# Patient Record
Sex: Female | Born: 1970 | Race: White | Hispanic: No | Smoking: Current every day smoker
Health system: Southern US, Community
[De-identification: ages and names within clinical notes are randomized; demographics above are authoritative.]

## PROBLEM LIST (undated history)

## (undated) DIAGNOSIS — G893 Neoplasm related pain (acute) (chronic): Secondary | ICD-10-CM

## (undated) DIAGNOSIS — R053 Chronic cough: Secondary | ICD-10-CM

## (undated) DIAGNOSIS — R05 Cough: Secondary | ICD-10-CM

## (undated) DIAGNOSIS — C539 Malignant neoplasm of cervix uteri, unspecified: Secondary | ICD-10-CM

## (undated) HISTORY — DX: Neoplasm related pain (acute) (chronic): G89.3

## (undated) HISTORY — DX: Malignant neoplasm of cervix uteri, unspecified: C53.9

## (undated) HISTORY — PX: CERVICAL BIOPSY  W/ LOOP ELECTRODE EXCISION: SUR135

## (undated) HISTORY — DX: Chronic cough: R05.3

---
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
## 2009-01-24 ENCOUNTER — Ambulatory Visit: Payer: Self-pay | Admitting: Internal Medicine

## 2014-09-17 DIAGNOSIS — C649 Malignant neoplasm of unspecified kidney, except renal pelvis: Secondary | ICD-10-CM

## 2014-09-17 HISTORY — DX: Malignant neoplasm of unspecified kidney, except renal pelvis: C64.9

## 2015-03-02 ENCOUNTER — Emergency Department
Admission: EM | Admit: 2015-03-02 | Discharge: 2015-03-02 | Disposition: A | Discharge status: Home / Self Care | Payer: Self-pay | Attending: Emergency Medicine | Admitting: Emergency Medicine

## 2015-03-02 ENCOUNTER — Emergency Department: Payer: Self-pay

## 2015-03-02 DIAGNOSIS — F10239 Alcohol dependence with withdrawal, unspecified: Secondary | ICD-10-CM | POA: Insufficient documentation

## 2015-03-02 DIAGNOSIS — Y998 Other external cause status: Secondary | ICD-10-CM | POA: Insufficient documentation

## 2015-03-02 DIAGNOSIS — Y9389 Activity, other specified: Secondary | ICD-10-CM | POA: Insufficient documentation

## 2015-03-02 DIAGNOSIS — X58XXXA Exposure to other specified factors, initial encounter: Secondary | ICD-10-CM | POA: Insufficient documentation

## 2015-03-02 DIAGNOSIS — S01512A Laceration without foreign body of oral cavity, initial encounter: Secondary | ICD-10-CM | POA: Insufficient documentation

## 2015-03-02 DIAGNOSIS — Z720 Tobacco use: Secondary | ICD-10-CM | POA: Insufficient documentation

## 2015-03-02 DIAGNOSIS — F10230 Alcohol dependence with withdrawal, uncomplicated: Secondary | ICD-10-CM

## 2015-03-02 DIAGNOSIS — Y9289 Other specified places as the place of occurrence of the external cause: Secondary | ICD-10-CM | POA: Insufficient documentation

## 2015-03-02 DIAGNOSIS — S0990XA Unspecified injury of head, initial encounter: Secondary | ICD-10-CM | POA: Insufficient documentation

## 2015-03-02 DIAGNOSIS — R569 Unspecified convulsions: Secondary | ICD-10-CM | POA: Insufficient documentation

## 2015-03-02 DIAGNOSIS — F10930 Alcohol use, unspecified with withdrawal, uncomplicated: Secondary | ICD-10-CM

## 2015-03-02 LAB — CBC WITH DIFFERENTIAL/PLATELET
Basophils Absolute: 0 10*3/uL (ref 0–0.1)
Basophils Relative: 0 %
EOS ABS: 0 10*3/uL (ref 0–0.7)
Eosinophils Relative: 1 %
HCT: 42.9 % (ref 35.0–47.0)
HEMOGLOBIN: 14.9 g/dL (ref 12.0–16.0)
LYMPHS ABS: 0.4 10*3/uL — AB (ref 1.0–3.6)
Lymphocytes Relative: 8 %
MCH: 36.9 pg — AB (ref 26.0–34.0)
MCHC: 34.6 g/dL (ref 32.0–36.0)
MCV: 106.6 fL — ABNORMAL HIGH (ref 80.0–100.0)
Monocytes Absolute: 0.4 10*3/uL (ref 0.2–0.9)
NEUTROS ABS: 4.8 10*3/uL (ref 1.4–6.5)
Neutrophils Relative %: 84 %
Platelets: 63 10*3/uL — ABNORMAL LOW (ref 150–440)
RBC: 4.02 MIL/uL (ref 3.80–5.20)
RDW: 13.7 % (ref 11.5–14.5)
WBC: 5.6 10*3/uL (ref 3.6–11.0)

## 2015-03-02 LAB — COMPREHENSIVE METABOLIC PANEL
ALT: 130 U/L — ABNORMAL HIGH (ref 14–54)
AST: 221 U/L — ABNORMAL HIGH (ref 15–41)
Albumin: 3.9 g/dL (ref 3.5–5.0)
Alkaline Phosphatase: 88 U/L (ref 38–126)
Anion gap: 17 — ABNORMAL HIGH (ref 5–15)
BUN: 7 mg/dL (ref 6–20)
CHLORIDE: 90 mmol/L — AB (ref 101–111)
CO2: 24 mmol/L (ref 22–32)
Calcium: 9.5 mg/dL (ref 8.9–10.3)
Creatinine, Ser: 0.69 mg/dL (ref 0.44–1.00)
Glucose, Bld: 112 mg/dL — ABNORMAL HIGH (ref 65–99)
Potassium: 3.1 mmol/L — ABNORMAL LOW (ref 3.5–5.1)
SODIUM: 131 mmol/L — AB (ref 135–145)
Total Bilirubin: 1.2 mg/dL (ref 0.3–1.2)
Total Protein: 6.8 g/dL (ref 6.5–8.1)

## 2015-03-02 LAB — ETHANOL: Alcohol, Ethyl (B): <5 mg/dL (ref <5)

## 2015-03-02 MED ORDER — CHLORDIAZEPOXIDE HCL 25 MG PO CAPS: 25.0000 mg | ORAL_CAPSULE | Freq: Once | ORAL | Status: DC

## 2015-03-02 MED ORDER — CHLORDIAZEPOXIDE HCL 25 MG PO CAPS
25.0000 mg | ORAL_CAPSULE | Freq: Once | ORAL | Status: AC
  Administered 2015-03-02: 25 mg via ORAL

## 2015-03-02 MED ORDER — ONDANSETRON HCL 4 MG/2ML IJ SOLN
INTRAMUSCULAR | Status: AC
  Administered 2015-03-02: 21:00:00
  Filled 2015-03-02: qty 2

## 2015-03-02 MED ORDER — CHLORDIAZEPOXIDE HCL 25 MG PO CAPS
ORAL_CAPSULE | ORAL | Status: AC
  Filled 2015-03-02: qty 1

## 2015-03-02 MED ORDER — LORAZEPAM 2 MG/ML IJ SOLN
INTRAMUSCULAR | Status: AC
  Administered 2015-03-02: 1 mg
  Filled 2015-03-02: qty 1

## 2015-03-02 NOTE — Discharge Instructions (Signed)
Alcohol Withdrawal Alcohol withdrawal happens when you normally drink alcohol a lot and suddenly stop drinking. Alcohol withdrawal symptoms can be mild to very bad. Mild withdrawal symptoms can include feeling sick to your stomach (nauseous), headache, or feeling irritable. Bad withdrawal symptoms can include shakiness, being very nervous (anxious), and not thinking clearly.  HOME CARE  Join an alcohol support group.  Stay away from people or situations that make you want to drink.  Eat a healthy diet. Eat a lot of fresh fruits, vegetables, and lean meats. GET HELP RIGHT AWAY IF:   You become confused. You start to see and hear things that are not really there.  You feel your heart beating very fast.  You throw up (vomit) blood or cannot stop throwing up. This may be bright red or look like black coffee grounds.  You have blood in your poop (stool). This may be bright red, maroon colored, or black and tarry.  You are lightheaded or pass out (faint).  You develop a fever. MAKE SURE YOU:   Understand these instructions.  Will watch your condition.  Will get help right away if you are not doing well or get worse. Document Released: 02/20/2008 Document Revised: 11/26/2011 Document Reviewed: 02/20/2008 [facility] Patient Information 2015 [place], [place]. This information is not intended to replace advice given to you by your health care provider. Make sure you discuss any questions you have with your health care provider.

## 2015-03-02 NOTE — ED Notes (Signed)
Pt presents to ED 26 via stretcher with [facility] for reports of AMS/seizure. Initially reported that pt has no hx of seizures but patient states that she had one 3 months ago but was not evaluated at that time,   Per EMS pt was initially responsive but confused/postictal. Mental status has improved en route to ED.   Seizure waS DESCRIBED as full body shaking with no responsiveness,   She fell out of a chair and hit face on kitchen floor,  Laceration to bridge of nose

## 2015-03-02 NOTE — ED Provider Notes (Signed)
[facility] Emergency Department Provider Note  Time seen: 8:17 PM  I have reviewed the triage vital signs and the nursing notes.   HISTORY  Chief Complaint Altered Mental Status    HPI Angelica Black is a 44 y.o. female who presents the emergency department after a likely seizure. According to the patient she was sitting down at her table talking with her boyfriend, the boyfriend states all of a sudden she dropped to the ground and began convulsing. States there was spit and blood coming out of her mouth, this lasted approximately 2 minutes and then the patient stopped convulsing. It took several minutes for her to speak after the event, and she remained confused for approximately 20-30 minutes. Denies ever having a seizure in the past. Patient's only complaint is mild headache. Patient currently alert and oriented. Further questioning reveals that the patient drinks alcohol most days of the week. According to the boyfriend up to 5 or 6 beers per day. 3 days ago the patient had no event where she began shaking, but never lost consciousness. She thought this was a panic attack. She is not drinking any alcohol since the event because she thought the alcohol may have contributed. The boyfriend states it is very abnormal for the patient to go more than one day without drinking alcohol. Patient denies any recreational drug use. Describes her headache as mild.    History reviewed. No pertinent past medical history.  There are no active problems to display for this patient.   Past Surgical History  Procedure Laterality Date  . Cervical biopsy  w/ loop electrode excision      No current outpatient prescriptions on file.  Allergies Review of patient's allergies indicates no known allergies.  No family history on file.  Social History History  Substance Use Topics  . Smoking status: Current Every Day Smoker -- 1.00 packs/day    Types: Cigarettes  . Smokeless  tobacco: Not on file  . Alcohol Use: Yes     Comment: ocassional     Review of Systems Constitutional: Negative for fever. Eyes: Negative for visual changes. ENT: Positive for tongue laceration Cardiovascular: Negative for chest pain. Respiratory: Negative for shortness of breath. Gastrointestinal: Negative for abdominal pain Musculoskeletal: Negative for back pain. Skin: Abrasion to nose. Neurological: Positive for mild headache. 10-point ROS otherwise negative.  ____________________________________________   PHYSICAL EXAM:  VITAL SIGNS: ED Triage Vitals  Enc Vitals Group     BP 03/02/15 1959 130/82 mmHg     Pulse Rate 03/02/15 1959 90     Resp --      Temp 03/02/15 1959 98.8 F (37.1 C)     Temp Source 03/02/15 1959 Oral     SpO2 03/02/15 1959 96 %     Weight 03/02/15 1959 181 lb (82.101 kg)     Height 03/02/15 1959 5\' 8"  (1.727 m)     Head Cir --      Peak Flow --      Pain Score 03/02/15 2001 8     Pain Loc --      Pain Edu? --      Excl. in [place]? --     Constitutional: Alert and oriented. Well appearing Eyes: Normal exam, 2 mm PERRL ENT   Head: Normocephalic. Abrasion to bridge of nose.   Mouth/Throat: Mucous membranes are moist. Small abrasion/laceration to tongue. Cardiovascular: Normal rate, regular rhythm. No murmur Respiratory: Normal respiratory effort without tachypnea nor retractions. Breath sounds are  clear Gastrointestinal: Soft and nontender. No distention.  Musculoskeletal: Nontender with normal range of motion in all extremities. Neurologic:  Normal speech and language. No gross focal neurologic deficits. Equal grip strengths bilaterally. No pronator drift.  Skin:  Skin is warm, dry and small abrasion to bridge of nose. Psychiatric: Mood and affect are normal. Speech and behavior are normal.  ____________________________________________   RADIOLOGY  CT head and C-spine within normal  limits.  ____________________________________________    INITIAL IMPRESSION / ASSESSMENT AND PLAN / ED COURSE  Pertinent labs & imaging results that were available during my care of the patient were reviewed by me and considered in my medical decision making (see chart for details).  Patient with likely tonic-clonic seizure, no history of seizures in the past. Given the patient's alcohol use, and no use 3 days the patient likely suffered an alcohol withdrawal seizure. We will check labs, CT head/C-spine, and closely monitor in the emergency department.  CTs are within normal limits. Labs are largely within normal limits. I discussed in private with the patient she drinks several beers 3-4 per day. She states it is been a very long time since he went over 24 hours without drinking. She has now been approximately 3 days without drinking. I discussed with the patient she does not wish to pursue an inpatient detox at this time, however she is very adamant that she will stop drinking. I discussed a Librium taper with the patient but immediately very very clear that she cannot drink while taking Librium as a can cause her to stop breathing and die. Patient understands, and wishes to pursue with the Librium taper for an outpatient detox. I discussed very strict return precautions with the patient, who is agreeable. We will discharge patient home at this time.  ____________________________________________   FINAL CLINICAL IMPRESSION(S) / ED DIAGNOSES  Seizure Alcohol withdrawal   Harvest Dark, MD 03/02/15 2115

## 2015-03-02 NOTE — ED Notes (Signed)
Family arrived to bedside,  They report that patient actually drinks at least 4-5 drinks ( beer or shots of liquor) daily.   She has not had a drink since Sunday according to family.  Patient also initially reported to RN that she had a seizure three months ago, but family reports that pt. Had a "panic attack" 3 days ago and that is why she has not had a drink since then.

## 2015-05-25 DIAGNOSIS — S2239XA Fracture of one rib, unspecified side, initial encounter for closed fracture: Secondary | ICD-10-CM | POA: Insufficient documentation

## 2015-05-25 DIAGNOSIS — S2249XA Multiple fractures of ribs, unspecified side, initial encounter for closed fracture: Secondary | ICD-10-CM | POA: Insufficient documentation

## 2015-05-25 DIAGNOSIS — S42017A Nondisplaced fracture of sternal end of right clavicle, initial encounter for closed fracture: Secondary | ICD-10-CM | POA: Insufficient documentation

## 2015-07-08 HISTORY — PX: PARTIAL NEPHRECTOMY: SHX414

## 2015-08-09 DIAGNOSIS — C649 Malignant neoplasm of unspecified kidney, except renal pelvis: Secondary | ICD-10-CM | POA: Insufficient documentation

## 2019-09-16 ENCOUNTER — Encounter: Payer: Self-pay | Admitting: Emergency Medicine

## 2019-09-16 ENCOUNTER — Emergency Department: Payer: Self-pay

## 2019-09-16 ENCOUNTER — Emergency Department
Admission: EM | Admit: 2019-09-16 | Discharge: 2019-09-16 | Disposition: A | Discharge status: Home / Self Care | Payer: Self-pay | Attending: Emergency Medicine | Admitting: Emergency Medicine

## 2019-09-16 ENCOUNTER — Other Ambulatory Visit: Payer: Self-pay

## 2019-09-16 DIAGNOSIS — R0602 Shortness of breath: Secondary | ICD-10-CM | POA: Insufficient documentation

## 2019-09-16 DIAGNOSIS — J9859 Other diseases of mediastinum, not elsewhere classified: Secondary | ICD-10-CM | POA: Insufficient documentation

## 2019-09-16 DIAGNOSIS — R222 Localized swelling, mass and lump, trunk: Secondary | ICD-10-CM | POA: Insufficient documentation

## 2019-09-16 DIAGNOSIS — F17210 Nicotine dependence, cigarettes, uncomplicated: Secondary | ICD-10-CM | POA: Insufficient documentation

## 2019-09-16 DIAGNOSIS — R079 Chest pain, unspecified: Secondary | ICD-10-CM | POA: Insufficient documentation

## 2019-09-16 DIAGNOSIS — R071 Chest pain on breathing: Secondary | ICD-10-CM | POA: Insufficient documentation

## 2019-09-16 LAB — CBC
HCT: 35.2 % — ABNORMAL LOW (ref 36.0–46.0)
Hemoglobin: 11.2 g/dL — ABNORMAL LOW (ref 12.0–15.0)
MCH: 29.6 pg (ref 26.0–34.0)
MCHC: 31.8 g/dL (ref 30.0–36.0)
MCV: 92.9 fL (ref 80.0–100.0)
Platelets: 320 10*3/uL (ref 150–400)
RBC: 3.79 MIL/uL — ABNORMAL LOW (ref 3.87–5.11)
RDW: 13.5 % (ref 11.5–15.5)
WBC: 6.8 10*3/uL (ref 4.0–10.5)
nRBC: 0 % (ref 0.0–0.2)

## 2019-09-16 LAB — BASIC METABOLIC PANEL
Anion gap: 10 (ref 5–15)
BUN: 13 mg/dL (ref 6–20)
CO2: 26 mmol/L (ref 22–32)
Calcium: 12.1 mg/dL — ABNORMAL HIGH (ref 8.9–10.3)
Chloride: 100 mmol/L (ref 98–111)
Creatinine, Ser: 0.9 mg/dL (ref 0.44–1.00)
GFR calc Af Amer: >60 mL/min (ref >60)
GFR calc non Af Amer: >60 mL/min (ref >60)
Glucose, Bld: 105 mg/dL — ABNORMAL HIGH (ref 70–99)
Potassium: 4.2 mmol/L (ref 3.5–5.1)
Sodium: 136 mmol/L (ref 135–145)

## 2019-09-16 LAB — TROPONIN I (HIGH SENSITIVITY)
Troponin I (High Sensitivity): <2 ng/L (ref <18)
Troponin I (High Sensitivity): <2 ng/L (ref <18)

## 2019-09-16 MED ORDER — HYDROMORPHONE HCL 1 MG/ML IJ SOLN
1.0000 mg | Freq: Once | INTRAMUSCULAR | Status: AC
  Administered 2019-09-16: 1 mg via INTRAVENOUS
  Filled 2019-09-16: qty 1

## 2019-09-16 MED ORDER — OXYCODONE HCL 5 MG PO TABS
10.0000 mg | ORAL_TABLET | Freq: Once | ORAL | Status: AC
  Administered 2019-09-16: 10 mg via ORAL
  Filled 2019-09-16: qty 2

## 2019-09-16 MED ORDER — OXYCODONE HCL 5 MG PO TABS: 5.0000 mg | ORAL_TABLET | Freq: Three times a day (TID) | ORAL | 0 refills | Status: DC | PRN

## 2019-09-16 MED ORDER — IOHEXOL 350 MG/ML SOLN
75.0000 mL | Freq: Once | INTRAVENOUS | Status: AC | PRN
  Administered 2019-09-16: 75 mL via INTRAVENOUS
  Filled 2019-09-16: qty 75

## 2019-09-16 NOTE — ED Notes (Signed)
Patient transported to CT 

## 2019-09-16 NOTE — ED Provider Notes (Signed)
[facility] Emergency Department Provider Note  Time seen: 4:29 PM  I have reviewed the triage vital signs and the nursing notes.   HISTORY  Chief Complaint Chest Pain, Shortness of Breath, and Shoulder Pain   HPI Angelica Black is a 48 y.o. female with a past medical history of alcoholism now 2 years mostly sober, history of liver carcinoma status post partial liver lobectomy 2 years ago, presents to the emergency department for chest pain and shortness of breath.  According to the patient for the past 3 weeks she has been experiencing chest pain worse with deep inspiration and occasional cough.  States shortness of breath for this timeframe as well.  States her chest pain is worse when she breathes or moves.  Denies any leg pain or swelling.  Denies any known fever.  History reviewed. No pertinent past medical history.  There are no problems to display for this patient.   Past Surgical History:  Procedure Laterality Date  . CERVICAL BIOPSY  W/ LOOP ELECTRODE EXCISION      Prior to Admission medications   Medication Sig Start Date End Date Taking? Authorizing Provider  chlordiazePOXIDE (LIBRIUM) 25 MG capsule Take 1 capsule (25 mg total) by mouth once. Day 1-2: Take one capsule every 8 hours Date 3-4: Take one capsule every 12 hours Day 5-6: Take one capsule in the morning Day 7: Stop 03/02/15   Harvest Dark, MD    No Known Allergies  No family history on file.  Social History Social History   Tobacco Use  . Smoking status: Current Every Day Smoker    Packs/day: 1.00    Types: Cigarettes  Substance Use Topics  . Alcohol use: Yes    Comment: ocassional   . Drug use: Not on file    Review of Systems Constitutional: Negative for fever. Cardiovascular: Positive for chest pain worse with deep inspiration. Respiratory: Negative for shortness of breath. Gastrointestinal: Negative for abdominal pain Musculoskeletal: Negative for  musculoskeletal complaints Neurological: Negative for headache All other ROS negative  ____________________________________________   PHYSICAL EXAM:  VITAL SIGNS: ED Triage Vitals  Enc Vitals Group     BP 09/16/19 1339 109/68     Pulse Rate 09/16/19 1339 93     Resp 09/16/19 1339 16     Temp 09/16/19 1339 98.2 F (36.8 C)     Temp Source 09/16/19 1339 Oral     SpO2 09/16/19 1339 99 %     Weight 09/16/19 1323 180 lb (81.6 kg)     Height 09/16/19 1323 5\' 8"  (1.727 m)     Head Circumference --      Peak Flow --      Pain Score 09/16/19 1322 10     Pain Loc --      Pain Edu? --      Excl. in [place]? --    Constitutional: Alert and oriented.  Appears mildly uncomfortable. Eyes: Normal exam ENT      Head: Normocephalic and atraumatic.      Mouth/Throat: Mucous membranes are moist. Cardiovascular: Normal rate, regular rhythm. No murmur Respiratory: Normal respiratory effort without tachypnea nor retractions. Breath sounds are clear  Gastrointestinal: Soft and nontender. No distention.   Musculoskeletal: Nontender with normal range of motion in all extremities. Neurologic:  Normal speech and language. No gross focal neurologic deficits Skin:  Skin is warm, dry and intact.  Psychiatric: Mood and affect are normal.  ____________________________________________    EKG  EKG viewed  and interpreted by myself shows a normal sinus rhythm at 96 bpm with a narrow QRS, normal axis, normal intervals, nonspecific ST changes.  ____________________________________________    RADIOLOGY  Chest x-ray consistent with widespread pulmonary nodules.  IMPRESSION:  1. Negative examination for pulmonary embolism.  2. Large pleural and chest wall mass of the anterior left upper  lobe, with overt destruction of the anterior left second rib.  3. There is a large adjacent pleural and anterior mediastinal mass  measuring at least 5.1 x 4.9 cm (series 5, image 120).  4. Numerous bulky mediastinal  and bilateral hilar lymph nodes,  consistent with metastatic disease.  5. Numerous pulmonary nodules.  6. Additional lytic lesions of the left aspect of the sternal body,  consistent with additional osseous involvement, likely due to direct  extension from adjacent masses.  7. There is an enlarged superior mediastinal/paraesophageal lymph  node which exerts mass effect on the right aspect of the esophagus  measuring approximately 2.3 x 1.7 cm (series 5, image 29). This may  be specifically symptomatic due to location. Correlate with  dysphagia.  8. Emphysema (ICD10-J43.9).   ____________________________________________   INITIAL IMPRESSION / ASSESSMENT AND PLAN / ED COURSE  Pertinent labs & imaging results that were available during my care of the patient were reviewed by me and considered in my medical decision making (see chart for details).   Patient presents to the emergency department for chest pain or shortness of breath ongoing for 3 weeks, pleuritic in nature.  Differential would include pulmonary embolus, pneumonia, metastatic process.  Chest x-ray consistent with possible metastatic process.  Patient is a daily smoker she also had a partial liver lobectomy for reported cancer per patient.  We obtain CT imaging of the chest with contrast PE protocol given her pleuritic nature but should also give [place] a better evaluation of her lung nodules.  Lab work is largely within normal limits including a troponin is negative.  Patient CT unfortunately shows a large central left-sided chest wall mass with destruction of ribs lytic lesions and appears to extend into the mediastinum with multiple mediastinal lymph nodes enlarged likely representing metastatic disease.  I discussed these unfortunate findings with the patient.  I also called Dr. Rogue Bussing of oncology.  He will call the patient to arrange a follow-up appointment within the next several days to arrange a biopsy in further imaging.   Patient understands today's findings and is agreeable to plan to go home and follow-up with oncology.  Angelica Black was evaluated in Emergency Department on 09/16/2019 for the symptoms described in the history of present illness. She was evaluated in the context of the global COVID-19 pandemic, which necessitated consideration that the patient might be at risk for infection with the SARS-CoV-2 virus that causes COVID-19. Institutional protocols and algorithms that pertain to the evaluation of patients at risk for COVID-19 are in a state of rapid change based on information released by regulatory bodies including the CDC and federal and state organizations. These policies and algorithms were followed during the patient's care in the ED.  ____________________________________________   FINAL CLINICAL IMPRESSION(S) / ED DIAGNOSES  Chest pain Chest mass   Harvest Dark, MD 09/16/19 1751

## 2019-09-16 NOTE — Discharge Instructions (Addendum)
You have been seen in the emergency department today for chest pain.  Your work-up unfortunately shows a fairly large mass within the left chest extending into the middle of the chest and multiple enlarged lymph nodes consistent with possible metastatic spread.  You should be receiving a call from oncology tomorrow to arrange a follow-up appointment.  If you do not hear from them by noon please call the number provided on your discharge paperwork.  Please take your pain medication as needed but only as prescribed.  Return to the emergency department for any worsening pain, any trouble breathing, or any other symptom personally concerning to yourself.

## 2019-09-17 ENCOUNTER — Other Ambulatory Visit: Payer: Self-pay

## 2019-09-17 ENCOUNTER — Inpatient Hospital Stay: Payer: Self-pay | Attending: Internal Medicine | Admitting: Internal Medicine

## 2019-09-17 ENCOUNTER — Inpatient Hospital Stay: Payer: Self-pay

## 2019-09-17 ENCOUNTER — Encounter: Payer: Self-pay | Admitting: Internal Medicine

## 2019-09-17 ENCOUNTER — Telehealth: Payer: Self-pay | Admitting: Internal Medicine

## 2019-09-17 VITALS — BP 107/65 | HR 103 | Temp 97.0°F | Resp 18 | Ht 68.0 in | Wt 138.0 lb

## 2019-09-17 DIAGNOSIS — R634 Abnormal weight loss: Secondary | ICD-10-CM | POA: Insufficient documentation

## 2019-09-17 DIAGNOSIS — R222 Localized swelling, mass and lump, trunk: Secondary | ICD-10-CM | POA: Insufficient documentation

## 2019-09-17 DIAGNOSIS — C642 Malignant neoplasm of left kidney, except renal pelvis: Secondary | ICD-10-CM

## 2019-09-17 DIAGNOSIS — Z8541 Personal history of malignant neoplasm of cervix uteri: Secondary | ICD-10-CM | POA: Insufficient documentation

## 2019-09-17 DIAGNOSIS — F17210 Nicotine dependence, cigarettes, uncomplicated: Secondary | ICD-10-CM | POA: Insufficient documentation

## 2019-09-17 DIAGNOSIS — Z85528 Personal history of other malignant neoplasm of kidney: Secondary | ICD-10-CM | POA: Insufficient documentation

## 2019-09-17 DIAGNOSIS — J439 Emphysema, unspecified: Secondary | ICD-10-CM | POA: Insufficient documentation

## 2019-09-17 DIAGNOSIS — Z79899 Other long term (current) drug therapy: Secondary | ICD-10-CM | POA: Insufficient documentation

## 2019-09-17 DIAGNOSIS — Z7189 Other specified counseling: Secondary | ICD-10-CM | POA: Insufficient documentation

## 2019-09-17 DIAGNOSIS — F419 Anxiety disorder, unspecified: Secondary | ICD-10-CM | POA: Insufficient documentation

## 2019-09-17 DIAGNOSIS — R0789 Other chest pain: Secondary | ICD-10-CM | POA: Insufficient documentation

## 2019-09-17 MED ORDER — MORPHINE SULFATE ER 30 MG PO TBCR: 30.0000 mg | EXTENDED_RELEASE_TABLET | Freq: Two times a day (BID) | ORAL | 0 refills | Status: DC

## 2019-09-17 MED ORDER — SODIUM CHLORIDE 0.9 % IV SOLN
Freq: Once | INTRAVENOUS | Status: AC
  Filled 2019-09-17: qty 250

## 2019-09-17 MED ORDER — HYDROCOD POLST-CPM POLST ER 10-8 MG/5ML PO SUER: 5.0000 mL | Freq: Every evening | ORAL | 0 refills | Status: DC | PRN

## 2019-09-17 MED ORDER — CITALOPRAM HYDROBROMIDE 20 MG PO TABS: 20.0000 mg | ORAL_TABLET | Freq: Every day | ORAL | 3 refills | Status: DC

## 2019-09-17 MED ORDER — OXYCODONE-ACETAMINOPHEN 10-325 MG PO TABS: 1.0000 | ORAL_TABLET | Freq: Three times a day (TID) | ORAL | 0 refills | Status: DC | PRN

## 2019-09-17 MED ORDER — ZOLEDRONIC ACID 4 MG/100ML IV SOLN
4.0000 mg | Freq: Once | INTRAVENOUS | Status: AC
  Administered 2019-09-17: 16:00:00 4 mg via INTRAVENOUS
  Filled 2019-09-17: qty 100

## 2019-09-17 NOTE — Assessment & Plan Note (Addendum)
#  Left chest wall mass; bilateral lung nodules; mediastinal adenopathy-highly concerning for malignancy.  Primary lung versus kidney cancer.  Discussed the need for a biopsy ASAP.  Discussed with IR/Dr. Hassel-biopsy of left chest wall mass.   #Discussed with patient that treatment options will be based upon the results of the biopsy/primary tumor.  Patient also need PET scan/brain MRI-we will schedule subsequent visits.  #Left chest wall pain-likely malignancy.  Recommend addition of MS Contin 30 mg twice daily; Percocet 10 mg every 8 hours.  New prescription given.  Radiation oncology evaluation.  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  Recommend Zometa.  Discussed the pros and cons of Zometa.  #Anxiety-recommend Celexa.  Would avoid benzodiazepines.  I spoke at length with the patient's friend, Velna Hatchet-  regarding the patient's clinical status/plan of care.  Family agreement.   # DISPOSITION: # zometa today # Radiation oncology referral ASAP re; chest wall mass # follow up TBD- Dr.B  # I reviewed the blood work- with the patient in detail; also reviewed the imaging independently [as summarized above]; and with the patient in detail.

## 2019-09-17 NOTE — Progress Notes (Signed)
[facility] CONSULT NOTE  Patient Care Team: Patient, No Pcp Per as PCP - General (General Practice)  CHIEF COMPLAINTS/PURPOSE OF CONSULTATION: Lung mass/history of kidney cancer#   Oncology History Overview Note  # DEC 2020- left chest wall mass; bil mediatisnal LN; bil Lung nodules.   #  2016[facility] ]Renal cell carcinoma, clear cell type, Furhman grade 2 measuring 6.4. It extended into major veins. Negative margins. Stage: pT3a, pNx  # 2002- cervical cancer LEEP/conization; no chemo-RT  # dec 2020- Hypercalcemia- Ca- 12  # NGS/MOLECULAR TESTS:    # PALLIATIVE CARE EVALUATION:  # PAIN MANAGEMENT:    DIAGNOSIS:   STAGE:         ;  GOALS:  CURRENT/MOST RECENT THERAPY :     Cancer of left kidney ([place])  09/17/2019 Initial Diagnosis   Cancer of left kidney (HCC)      HISTORY OF PRESENTING ILLNESS:  Angelica Black 48 y.o.  female with a history of kidney cancer stage II; and history of cervical cancer is currently referred to [place] for further evaluation recommendations for left chest wall mass/bilateral lung nodules.  Patient was recently evaluated in the emergency room for left chest wall pain.  CT scan showed above imaging findings concerning for malignancy.   States the pain had been going on for the last 4 weeks.  No headaches.  No nausea vomiting.  Review of Systems  Constitutional: Positive for weight loss. Negative for chills, diaphoresis, fever and malaise/fatigue.  HENT: Negative for nosebleeds and sore throat.   Eyes: Negative for double vision.  Respiratory: Positive for cough and shortness of breath. Negative for hemoptysis, sputum production and wheezing.   Cardiovascular: Positive for chest pain. Negative for palpitations, orthopnea and leg swelling.  Gastrointestinal: Negative for abdominal pain, blood in stool, constipation, diarrhea, heartburn, melena, nausea and vomiting.  Genitourinary: Negative for dysuria, frequency and urgency.   Musculoskeletal: Positive for back pain and joint pain.  Skin: Negative.  Negative for itching and rash.  Neurological: Positive for tingling. Negative for dizziness, focal weakness, weakness and headaches.  Endo/Heme/Allergies: Does not bruise/bleed easily.  Psychiatric/Behavioral: Negative for depression. The patient is not nervous/anxious and does not have insomnia.      MEDICAL HISTORY:  Past Medical History:  Diagnosis Date  . Cervical cancer ([place])   . Renal cell carcinoma ([place]) 2016   Pathologic stage from 07/08/2015: Stage Unknown (T3a, NX, cM0) - Signed by Darin Engels, NP on 11/25/2015    SURGICAL HISTORY: Past Surgical History:  Procedure Laterality Date  . CERVICAL BIOPSY  W/ LOOP ELECTRODE EXCISION    . PARTIAL NEPHRECTOMY Left 07/08/2015   robotic laparoscopic nephrectomy at [place]: Social History   Socioeconomic History  . Marital status: Divorced    Spouse name: Not on file  . Number of children: Not on file  . Years of education: Not on file  . Highest education level: Not on file  Occupational History  . Not on file  Tobacco Use  . Smoking status: Current Every Day Smoker    Packs/day: 1.00    Years: 35.00    Pack years: 35.00    Types: Cigarettes  . Smokeless tobacco: Never Used  Substance and Sexual Activity  . Alcohol use: Yes    Comment: ocassional   . Drug use: Not on file  . Sexual activity: Not on file  Other Topics Concern  . 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 Difficulty of Paying Living Expenses: Not on file  Food Insecurity:   . Worried About Charity fundraiser in the Last Year: Not on file  . Ran Out of Food in the Last Year: Not on file  Transportation Needs:   . Lack of Transportation  (Medical): Not on file  . Lack of Transportation (Non-Medical): Not on file  Physical Activity:   . Days of Exercise per Week: Not on file  . Minutes of Exercise per Session: Not on file  Stress:   . Feeling of Stress : Not on file  Social Connections:   . Frequency of Communication with Friends and Family: Not on file  . Frequency of Social Gatherings with Friends and Family: Not on file  . Attends Religious Services: Not on file  . Active Member of Clubs or Organizations: Not on file  . Attends Archivist Meetings: Not on file  . Marital Status: Not on file  Intimate Partner Violence:   . Fear of Current or Ex-Partner: Not on file  . Emotionally Abused: Not on file  . Physically Abused: Not on file  . Sexually Abused: Not on file    FAMILY HISTORY: Family History  Problem Relation Age of Onset  . Hypertension Father     ALLERGIES:  has No Known Allergies.  MEDICATIONS:  Current Outpatient Medications  Medication Sig Dispense Refill  . Aspirin-Salicylamide-Caffeine (BC HEADACHE POWDER PO) Take 1 packet by mouth 3 (three) times daily.    . chlorpheniramine-HYDROcodone (TUSSIONEX) 10-8 MG/5ML SUER Take 5 mLs by mouth at bedtime as needed for cough. 140 mL 0  . citalopram (CELEXA) 20 MG tablet Take 1 tablet (20 mg total) by mouth daily. 30 tablet 3  . morphine (MS CONTIN) 30 MG 12 hr tablet Take 1 tablet (30 mg total) by mouth every 12 (twelve) hours. 30 tablet 0  . oxyCODONE-acetaminophen (PERCOCET) 10-325 MG tablet Take 1 tablet by mouth every 8 (eight) hours as needed for pain. 45 tablet 0   No current facility-administered medications for this visit.      Marland Kitchen  PHYSICAL EXAMINATION: ECOG PERFORMANCE STATUS: 1 - Symptomatic but completely ambulatory  Vitals:   09/17/19 1400 09/17/19 1413  BP: (!) 78/64 107/65  Pulse: (!) 104 (!) 103  Resp: 18   Temp: (!) 97 F (36.1 C)   SpO2: 98%    Filed Weights   09/17/19 1400  Weight: 138 lb (62.6 kg)     Physical Exam  Constitutional: She is oriented to person, place, and time and well-developed, well-nourished, and in no distress.  Thin built female patient.  She is alone.  She is in a wheelchair.  HENT:  Head: Normocephalic and atraumatic.  Mouth/Throat: Oropharynx is clear and moist. No oropharyngeal exudate.  Eyes: Pupils are equal, round, and reactive to light.  Cardiovascular: Normal rate and regular rhythm.  Pulmonary/Chest: No respiratory distress. She has no wheezes.  Decreased air entry bilaterally.  Abdominal: Soft. Bowel sounds are normal. She exhibits no distension and no mass. There is no abdominal tenderness. There is no rebound and no guarding.  Musculoskeletal:        General: No tenderness or edema. Normal range of motion.     Cervical back: Normal range of motion and neck  supple.  Neurological: She is alert and oriented to person, place, and time.  Skin: Skin is warm.  Vague mass noted in the left chest wall region.  Tender to touch.  Psychiatric: Affect normal.  Anxious.    LABORATORY DATA:  I have reviewed the data as listed Lab Results  Component Value Date   WBC 6.8 09/16/2019   HGB 11.2 (L) 09/16/2019   HCT 35.2 (L) 09/16/2019   MCV 92.9 09/16/2019   PLT 320 09/16/2019   Recent Labs    09/16/19 1403  NA 136  K 4.2  CL 100  CO2 26  GLUCOSE 105*  BUN 13  CREATININE 0.90  CALCIUM 12.1*  GFRNONAA >60  GFRAA >60    RADIOGRAPHIC STUDIES: I have personally reviewed the radiological images as listed and agreed with the findings in the report. DG Chest 2 View  Result Date: 09/16/2019 CLINICAL DATA:  Seizure.  Chest pain and shortness of breath EXAM: CHEST - 2 VIEW COMPARISON:  None. FINDINGS: There are multiple nodular opacities throughout the lungs ranging in size from as small as 4 mm to as large as 2.5 by 2.5 cm. No edema or consolidation. Heart size and pulmonary vascularity are normal. There is equivocal fullness in the left hilum,  potentially representing adenopathy. Evidence of old fracture of the lateral left clavicle. No well-defined blastic or lytic bone lesions. No pneumothorax. IMPRESSION: Widespread pulmonary nodular opacities. Appearance suspicious for metastatic disease. Question a degree of hilar adenopathy. It may be prudent to correlate with contrast enhanced chest CT given these findings. No edema or consolidation.  Heart size normal. Electronically Signed   By: Lowella Grip III M.D.   On: 09/16/2019 14:25   CT Angio Chest PE W and/or Wo Contrast  Result Date: 09/16/2019 CLINICAL DATA:  Pleuritic chest pain, abnormal chest x-ray EXAM: CT ANGIOGRAPHY CHEST WITH CONTRAST TECHNIQUE: Multidetector CT imaging of the chest was performed using the standard protocol during bolus administration of intravenous contrast. Multiplanar CT image reconstructions and MIPs were obtained to evaluate the vascular anatomy. CONTRAST:  44mL OMNIPAQUE IOHEXOL 350 MG/ML SOLN COMPARISON:  Same day chest radiograph FINDINGS: Cardiovascular: Satisfactory opacification of the pulmonary arteries to the segmental level. No evidence of pulmonary embolism. Normal heart size. Left coronary artery calcifications. No pericardial effusion. Mediastinum/Nodes: Numerous bulky mediastinal and bilateral hilar lymph nodes. Largest subcarinal node measures 3.5 x 2.6 cm (series 5, image 142). There is an enlarged superior mediastinal/paraesophageal lymph node which exerts mass effect on the right aspect of the esophagus measuring approximately 2.3 x 1.7 cm (series 5, image 29). There is a large pleural and anterior mediastinal mass measuring at least 5.1 x 4.9 cm (series 5, image 120). Thyroid gland, trachea, and esophagus demonstrate no significant findings. Lungs/Pleura: Mild emphysema. There are numerous small pulmonary nodules throughout the lungs bilaterally and multiple left-sided pleural nodules and masses. There is a large pleural and chest wall mass of  the anterior left upper lobe, with overt destruction of the anterior left second rib, measuring at least 6.9 x 4.6 cm (series 5, image 87). No pleural effusion or pneumothorax. Upper Abdomen: No acute abnormality. Musculoskeletal: No chest wall abnormality. Destructive soft tissue masses involving the anterior left ribs as detailed above. Additional lytic lesions of the left aspect of the sternal body (series 6, image 34, 45). Review of the MIP images confirms the above findings. IMPRESSION: 1. Negative examination for pulmonary embolism. 2. Large pleural and chest wall mass of the anterior left upper  lobe, with overt destruction of the anterior left second rib. 3. There is a large adjacent pleural and anterior mediastinal mass measuring at least 5.1 x 4.9 cm (series 5, image 120). 4. Numerous bulky mediastinal and bilateral hilar lymph nodes, consistent with metastatic disease. 5. Numerous pulmonary nodules. 6. Additional lytic lesions of the left aspect of the sternal body, consistent with additional osseous involvement, likely due to direct extension from adjacent masses. 7. There is an enlarged superior mediastinal/paraesophageal lymph node which exerts mass effect on the right aspect of the esophagus measuring approximately 2.3 x 1.7 cm (series 5, image 29). This may be specifically symptomatic due to location. Correlate with dysphagia. 8. Emphysema (ICD10-J43.9). Electronically Signed   By: Eddie Candle M.D.   On: 09/16/2019 17:27    ASSESSMENT & PLAN:   Cancer of left kidney ([place]) #Left chest wall mass; bilateral lung nodules; mediastinal adenopathy-highly concerning for malignancy.  Primary lung versus kidney cancer.  Discussed the need for a biopsy ASAP.  Discussed with IR/Dr. Hassel-biopsy of left chest wall mass.   #Discussed with patient that treatment options will be based upon the results of the biopsy/primary tumor.  Patient also need PET scan/brain MRI-we will schedule subsequent  visits.  #Left chest wall pain-likely malignancy.  Recommend addition of MS Contin 30 mg twice daily; Percocet 10 mg every 8 hours.  New prescription given.  Radiation oncology evaluation.  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  Recommend Zometa.  Discussed the pros and cons of Zometa.  #Anxiety-recommend Celexa.  Would avoid benzodiazepines.  I spoke at length with the patient's friend, Velna Hatchet-  regarding the patient's clinical status/plan of care.  Family agreement.   # DISPOSITION: # zometa today # Radiation oncology referral ASAP re; chest wall mass # follow up TBD- Dr.B  # I reviewed the blood work- with the patient in detail; also reviewed the imaging independently [as summarized above]; and with the patient in detail.    All questions were answered. The patient knows to call the clinic with any problems, questions or concerns.    Cammie Sickle, MD 09/17/2019 4:26 PM

## 2019-09-17 NOTE — Telephone Encounter (Signed)
On 12/30- ER doc called re: concerning for lung mass/concerning for malignancy.  Melissa- please check if pt can come in today [12/31] at 2 pm. No labs.   GB

## 2019-09-21 ENCOUNTER — Telehealth: Payer: Self-pay | Admitting: *Deleted

## 2019-09-21 ENCOUNTER — Other Ambulatory Visit: Payer: Self-pay | Admitting: *Deleted

## 2019-09-21 NOTE — Telephone Encounter (Signed)
Spoke with patient- apt times/date for covid testing and chest wall biopsy discussed with patient. Pt will have caregiver transport her to the appts. Pt aware npo after midnight 6- 8 hours prior to biopsy date (09/24/19 with arrival time of 830 am). She will come tomorrow 09/22/19 at 8 am at preadmit testing for drive up covid testing. Read back process performed with patient.  Pt also aware of time/date of upcoming consult with Dr. Massie Maroon.

## 2019-09-21 NOTE — Telephone Encounter (Signed)
Call from family member on behalf of patient requesting refill on Tussionex. This was just filled 12/31 for 140 ml to take 5 ml at hs as needed. I called patient who states she has been taking 8 ml at hs and apologized that she has been taking it wrong. She states she still has at least .5 oz left. I informed her that it is too soon to refill this and to call when she gets close t being out of medicine and to only take 5 ml as directed.She stated she will

## 2019-09-22 ENCOUNTER — Other Ambulatory Visit
Admission: RE | Admit: 2019-09-22 | Discharge: 2019-09-22 | Disposition: A | Discharge status: Home / Self Care | Payer: Medicaid Other | Source: Ambulatory Visit | Attending: Internal Medicine | Admitting: Internal Medicine

## 2019-09-22 ENCOUNTER — Other Ambulatory Visit: Payer: Self-pay

## 2019-09-22 DIAGNOSIS — F17210 Nicotine dependence, cigarettes, uncomplicated: Secondary | ICD-10-CM | POA: Diagnosis not present

## 2019-09-22 DIAGNOSIS — Z79899 Other long term (current) drug therapy: Secondary | ICD-10-CM | POA: Insufficient documentation

## 2019-09-22 DIAGNOSIS — R222 Localized swelling, mass and lump, trunk: Secondary | ICD-10-CM | POA: Insufficient documentation

## 2019-09-22 DIAGNOSIS — C642 Malignant neoplasm of left kidney, except renal pelvis: Secondary | ICD-10-CM | POA: Diagnosis not present

## 2019-09-22 DIAGNOSIS — Z01812 Encounter for preprocedural laboratory examination: Secondary | ICD-10-CM | POA: Insufficient documentation

## 2019-09-22 DIAGNOSIS — Z7982 Long term (current) use of aspirin: Secondary | ICD-10-CM | POA: Insufficient documentation

## 2019-09-22 DIAGNOSIS — Z20822 Contact with and (suspected) exposure to covid-19: Secondary | ICD-10-CM | POA: Insufficient documentation

## 2019-09-22 LAB — SARS CORONAVIRUS 2 (TAT 6-24 HRS): SARS Coronavirus 2: NEGATIVE

## 2019-09-22 NOTE — Progress Notes (Signed)
Patient on schedule for Chest Wall Mass bx 09/24/2019, to be here @ 0830, NPO after MN prior to procedure. Spoke with patient and caregiver Angelica Black with questions answered. Stated understanding. Pt. Did come today and got required COVID test done.

## 2019-09-23 ENCOUNTER — Other Ambulatory Visit: Payer: Self-pay | Admitting: Radiology

## 2019-09-24 ENCOUNTER — Ambulatory Visit
Admission: RE | Admit: 2019-09-24 | Discharge: 2019-09-24 | Disposition: A | Discharge status: Home / Self Care | Payer: Self-pay | Source: Ambulatory Visit | Attending: Internal Medicine | Admitting: Internal Medicine

## 2019-09-24 ENCOUNTER — Other Ambulatory Visit: Payer: Self-pay

## 2019-09-24 ENCOUNTER — Institutional Professional Consult (permissible substitution): Payer: Medicaid Other | Admitting: Radiation Oncology

## 2019-09-24 DIAGNOSIS — Z85528 Personal history of other malignant neoplasm of kidney: Secondary | ICD-10-CM | POA: Insufficient documentation

## 2019-09-24 DIAGNOSIS — Z79899 Other long term (current) drug therapy: Secondary | ICD-10-CM | POA: Insufficient documentation

## 2019-09-24 DIAGNOSIS — Z8541 Personal history of malignant neoplasm of cervix uteri: Secondary | ICD-10-CM | POA: Insufficient documentation

## 2019-09-24 DIAGNOSIS — R222 Localized swelling, mass and lump, trunk: Secondary | ICD-10-CM | POA: Insufficient documentation

## 2019-09-24 DIAGNOSIS — Z7982 Long term (current) use of aspirin: Secondary | ICD-10-CM | POA: Insufficient documentation

## 2019-09-24 DIAGNOSIS — R1310 Dysphagia, unspecified: Secondary | ICD-10-CM | POA: Insufficient documentation

## 2019-09-24 DIAGNOSIS — J439 Emphysema, unspecified: Secondary | ICD-10-CM | POA: Insufficient documentation

## 2019-09-24 DIAGNOSIS — F17210 Nicotine dependence, cigarettes, uncomplicated: Secondary | ICD-10-CM | POA: Insufficient documentation

## 2019-09-24 DIAGNOSIS — C642 Malignant neoplasm of left kidney, except renal pelvis: Secondary | ICD-10-CM | POA: Insufficient documentation

## 2019-09-24 LAB — CBC
HCT: 30.3 % — ABNORMAL LOW (ref 36.0–46.0)
Hemoglobin: 9.6 g/dL — ABNORMAL LOW (ref 12.0–15.0)
MCH: 29.6 pg (ref 26.0–34.0)
MCHC: 31.7 g/dL (ref 30.0–36.0)
MCV: 93.5 fL (ref 80.0–100.0)
Platelets: 327 10*3/uL (ref 150–400)
RBC: 3.24 MIL/uL — ABNORMAL LOW (ref 3.87–5.11)
RDW: 14.4 % (ref 11.5–15.5)
WBC: 7.5 10*3/uL (ref 4.0–10.5)
nRBC: 0 % (ref 0.0–0.2)

## 2019-09-24 LAB — PROTIME-INR
INR: 1 (ref 0.8–1.2)
Prothrombin Time: 13.5 seconds (ref 11.4–15.2)

## 2019-09-24 MED ORDER — FENTANYL CITRATE (PF) 100 MCG/2ML IJ SOLN
INTRAMUSCULAR | Status: AC | PRN
  Administered 2019-09-24 (×2): 50 ug via INTRAVENOUS

## 2019-09-24 MED ORDER — FENTANYL CITRATE (PF) 100 MCG/2ML IJ SOLN
INTRAMUSCULAR | Status: AC
  Filled 2019-09-24: qty 2

## 2019-09-24 MED ORDER — MIDAZOLAM HCL 5 MG/5ML IJ SOLN
INTRAMUSCULAR | Status: AC
  Filled 2019-09-24: qty 5

## 2019-09-24 MED ORDER — MIDAZOLAM HCL 2 MG/2ML IJ SOLN
INTRAMUSCULAR | Status: AC | PRN
  Administered 2019-09-24 (×2): 1 mg via INTRAVENOUS

## 2019-09-24 MED ORDER — SODIUM CHLORIDE 0.9 % IV SOLN: INTRAVENOUS | Status: DC

## 2019-09-24 NOTE — Progress Notes (Signed)
Patient clinically stable post Chest Wall mass biopsy.. tolerated well with vitals stable throughout procedure. Received Versed 2mg  along with Fentanyl 112mcg IV for procedure. Report given to [place] in specials for post procedure recovery. bandade dressing dry/intact to upper left chest.

## 2019-09-24 NOTE — H&P (Signed)
Chief Complaint:   Chest wall mass  Referring Physician(s): Brahmanday,Govinda R   History of Present Illness: Angelica Black is a 49 y.o. female with a large anterior left chest wall mass.  Suspect metastatic renal ca.  Plan for CT bx today.  No new complaints. No recent illness or fever.  Past Medical History:  Diagnosis Date  . Cervical cancer ([place])   . Renal cell carcinoma ([place]) 2016   Pathologic stage from 07/08/2015: Stage Unknown (T3a, NX, cM0) - Signed by Darin Engels, NP on 11/25/2015    Past Surgical History:  Procedure Laterality Date  . CERVICAL BIOPSY  W/ LOOP ELECTRODE EXCISION    . PARTIAL NEPHRECTOMY Left 07/08/2015   robotic laparoscopic nephrectomy at [facility]    Allergies: Patient has no known allergies.  Medications: Prior to Admission medications   Medication Sig Start Date End Date Taking? Authorizing Provider  Aspirin-Salicylamide-Caffeine (BC HEADACHE POWDER PO) Take 1 packet by mouth 3 (three) times daily.   Yes [provider]  chlorpheniramine-HYDROcodone (TUSSIONEX) 10-8 MG/5ML SUER Take 5 mLs by mouth at bedtime as needed for cough. 09/17/19  Yes Cammie Sickle, MD  citalopram (CELEXA) 20 MG tablet Take 1 tablet (20 mg total) by mouth daily. 09/17/19  Yes Cammie Sickle, MD  morphine (MS CONTIN) 30 MG 12 hr tablet Take 1 tablet (30 mg total) by mouth every 12 (twelve) hours. 09/17/19  Yes Cammie Sickle, MD  oxyCODONE-acetaminophen (PERCOCET) 10-325 MG tablet Take 1 tablet by mouth every 8 (eight) hours as needed for pain. 09/17/19  Yes Cammie Sickle, MD     Family History  Problem Relation Age of Onset  . Hypertension Father     Social History   Socioeconomic History  . Marital status: Divorced    Spouse name: Not on file  . Number of children: Not on file  . Years of education: Not on file  . Highest education level: Not on file  Occupational History  . Not on file  Tobacco Use  .  Smoking status: Current Every Day Smoker    Packs/day: 1.00    Years: 35.00    Pack years: 35.00    Types: Cigarettes  . Smokeless tobacco: Never Used  Substance and Sexual Activity  . Alcohol use: Yes    Comment: ocassional   . Drug use: Not on file  . Sexual activity: Not on file  Other Topics Concern  . 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 Difficulty of Paying Living Expenses: Not on file  Food Insecurity:   . Worried About Charity fundraiser in the Last Year: Not on file  . Ran Out of Food in the Last Year: Not on file  Transportation Needs:   . Lack of Transportation (Medical): Not on file  . Lack of Transportation (Non-Medical): Not on file  Physical Activity:   . Days of Exercise per Week: Not on file  . Minutes of Exercise per Session: Not on file  Stress:   . Feeling of Stress : Not on file  Social Connections:   . Frequency of Communication with Friends and Family: Not on file  . Frequency of Social Gatherings with Friends and Family: Not on file  . Attends Religious  Services: Not on file  . Active Member of Clubs or Organizations: Not on file  . Attends Archivist Meetings: Not on file  . Marital Status: Not on file      Review of Systems: A 12 point ROS discussed and pertinent positives are indicated in the HPI above.  All other systems are negative.  Review of Systems  Vital Signs: BP 96/63   Pulse 80   Temp 97.8 F (36.6 C) (Oral)   Resp 20   Ht 5' 8"  (1.727 m)   Wt 63.5 kg   LMP  (LMP Unknown)   SpO2 92%   BMI 21.29 kg/m   Physical Exam Constitutional:      General: She is not in acute distress.    Appearance: She is not toxic-appearing or diaphoretic.  Eyes:     General: No scleral icterus.    Conjunctiva/sclera:  Conjunctivae normal.  Cardiovascular:     Rate and Rhythm: Normal rate and regular rhythm.  Pulmonary:     Effort: Pulmonary effort is normal. No respiratory distress.     Breath sounds: Normal breath sounds.  Abdominal:     General: Abdomen is flat. Bowel sounds are normal.     Palpations: Abdomen is soft.  Skin:    General: Skin is warm and dry.  Neurological:     General: No focal deficit present.     Mental Status: Mental status is at baseline.  Psychiatric:        Mood and Affect: Mood normal.        Thought Content: Thought content normal.     Imaging: DG Chest 2 View  Result Date: 09/16/2019 CLINICAL DATA:  Seizure.  Chest pain and shortness of breath EXAM: CHEST - 2 VIEW COMPARISON:  None. FINDINGS: There are multiple nodular opacities throughout the lungs ranging in size from as small as 4 mm to as large as 2.5 by 2.5 cm. No edema or consolidation. Heart size and pulmonary vascularity are normal. There is equivocal fullness in the left hilum, potentially representing adenopathy. Evidence of old fracture of the lateral left clavicle. No well-defined blastic or lytic bone lesions. No pneumothorax. IMPRESSION: Widespread pulmonary nodular opacities. Appearance suspicious for metastatic disease. Question a degree of hilar adenopathy. It may be prudent to correlate with contrast enhanced chest CT given these findings. No edema or consolidation.  Heart size normal. Electronically Signed   By: Lowella Grip III M.D.   On: 09/16/2019 14:25   CT Angio Chest PE W and/or Wo Contrast  Result Date: 09/16/2019 CLINICAL DATA:  Pleuritic chest pain, abnormal chest x-ray EXAM: CT ANGIOGRAPHY CHEST WITH CONTRAST TECHNIQUE: Multidetector CT imaging of the chest was performed using the standard protocol during bolus administration of intravenous contrast. Multiplanar CT image reconstructions and MIPs were obtained to evaluate the vascular anatomy. CONTRAST:  16m OMNIPAQUE IOHEXOL 350 MG/ML  SOLN COMPARISON:  Same day chest radiograph FINDINGS: Cardiovascular: Satisfactory opacification of the pulmonary arteries to the segmental level. No evidence of pulmonary embolism. Normal heart size. Left coronary artery calcifications. No pericardial effusion. Mediastinum/Nodes: Numerous bulky mediastinal and bilateral hilar lymph nodes. Largest subcarinal node measures 3.5 x 2.6 cm (series 5, image 142). There is an enlarged superior mediastinal/paraesophageal lymph node which exerts mass effect on the right aspect of the esophagus measuring approximately 2.3 x 1.7 cm (series 5, image 29). There is a large pleural and anterior mediastinal mass measuring at least 5.1 x 4.9 cm (series 5, image 120). Thyroid gland,  trachea, and esophagus demonstrate no significant findings. Lungs/Pleura: Mild emphysema. There are numerous small pulmonary nodules throughout the lungs bilaterally and multiple left-sided pleural nodules and masses. There is a large pleural and chest wall mass of the anterior left upper lobe, with overt destruction of the anterior left second rib, measuring at least 6.9 x 4.6 cm (series 5, image 87). No pleural effusion or pneumothorax. Upper Abdomen: No acute abnormality. Musculoskeletal: No chest wall abnormality. Destructive soft tissue masses involving the anterior left ribs as detailed above. Additional lytic lesions of the left aspect of the sternal body (series 6, image 34, 45). Review of the MIP images confirms the above findings. IMPRESSION: 1. Negative examination for pulmonary embolism. 2. Large pleural and chest wall mass of the anterior left upper lobe, with overt destruction of the anterior left second rib. 3. There is a large adjacent pleural and anterior mediastinal mass measuring at least 5.1 x 4.9 cm (series 5, image 120). 4. Numerous bulky mediastinal and bilateral hilar lymph nodes, consistent with metastatic disease. 5. Numerous pulmonary nodules. 6. Additional lytic lesions of the  left aspect of the sternal body, consistent with additional osseous involvement, likely due to direct extension from adjacent masses. 7. There is an enlarged superior mediastinal/paraesophageal lymph node which exerts mass effect on the right aspect of the esophagus measuring approximately 2.3 x 1.7 cm (series 5, image 29). This may be specifically symptomatic due to location. Correlate with dysphagia. 8. Emphysema (ICD10-J43.9). Electronically Signed   By: Eddie Candle M.D.   On: 09/16/2019 17:27    Labs:  CBC: Recent Labs    09/16/19 1403 09/24/19 0949  WBC 6.8 7.5  HGB 11.2* 9.6*  HCT 35.2* 30.3*  PLT 320 327    COAGS: Recent Labs    09/24/19 0949  INR 1.0    BMP: Recent Labs    09/16/19 1403  NA 136  K 4.2  CL 100  CO2 26  GLUCOSE 105*  BUN 13  CALCIUM 12.1*  CREATININE 0.90  GFRNONAA >60  GFRAA >60    LIVER FUNCTION TESTS: No results for input(s): BILITOT, AST, ALT, ALKPHOS, PROT, ALBUMIN in the last 8760 hours.  TUMOR MARKERS: No results for input(s): AFPTM, CEA, CA199, CHROMGRNA in the last 8760 hours.  Assessment and Plan:  Anterior chest wall mass.  Plan for CT bx today.  Risks and benefits of CT bx was discussed with the patient and/or patient's family including, but not limited to bleeding, infection, damage to adjacent structures or low yield requiring additional tests.  All of the questions were answered and there is agreement to proceed.  Consent signed and in chart.    Thank you for this interesting consult.  I greatly enjoyed meeting MANVI GUILLIAMS and look forward to participating in their care.  A copy of this report was sent to the requesting provider on this date.  Electronically Signed: Greggory Keen, MD 09/24/2019, 10:32 AM   I spent a total of  30 Minutes   in face to face in clinical consultation, greater than 50% of which was counseling/coordinating care for this patient with met ca suspect renal primary

## 2019-09-24 NOTE — Discharge Instructions (Signed)

## 2019-09-24 NOTE — Procedures (Signed)
Left chest wall mass  S/p CT BX  No comp Stable ebl min Path pending Full report in pacs

## 2019-09-25 ENCOUNTER — Telehealth: Payer: Self-pay | Admitting: Internal Medicine

## 2019-09-25 DIAGNOSIS — C642 Malignant neoplasm of left kidney, except renal pelvis: Secondary | ICD-10-CM

## 2019-09-25 NOTE — Telephone Encounter (Signed)
It appears the patient had biopsy yesterday.  Please have the patient follow-up with me on 01/13-  MD; labs-CBC BMP; possible Zometa/possible IV fluids for 1 hour.

## 2019-09-25 NOTE — Addendum Note (Signed)
Addended by: Sabino Gasser on: 09/25/2019 11:16 AM   Modules accepted: Orders

## 2019-09-25 NOTE — Telephone Encounter (Signed)
Angelica Black - please schedule apts. Thanks

## 2019-09-28 ENCOUNTER — Ambulatory Visit
Admission: RE | Admit: 2019-09-28 | Discharge: 2019-09-28 | Disposition: A | Discharge status: Home / Self Care | Payer: Medicaid Other | Source: Ambulatory Visit | Attending: Radiation Oncology | Admitting: Radiation Oncology

## 2019-09-28 ENCOUNTER — Encounter: Payer: Self-pay | Admitting: Radiation Oncology

## 2019-09-28 ENCOUNTER — Other Ambulatory Visit: Payer: Self-pay | Admitting: Anatomic Pathology & Clinical Pathology

## 2019-09-28 ENCOUNTER — Other Ambulatory Visit: Payer: Self-pay

## 2019-09-28 VITALS — BP 101/61 | HR 91 | Resp 16 | Wt 143.0 lb

## 2019-09-28 DIAGNOSIS — Z79899 Other long term (current) drug therapy: Secondary | ICD-10-CM | POA: Insufficient documentation

## 2019-09-28 DIAGNOSIS — R222 Localized swelling, mass and lump, trunk: Secondary | ICD-10-CM

## 2019-09-28 DIAGNOSIS — Z8541 Personal history of malignant neoplasm of cervix uteri: Secondary | ICD-10-CM | POA: Insufficient documentation

## 2019-09-28 DIAGNOSIS — C649 Malignant neoplasm of unspecified kidney, except renal pelvis: Secondary | ICD-10-CM | POA: Insufficient documentation

## 2019-09-28 DIAGNOSIS — F17210 Nicotine dependence, cigarettes, uncomplicated: Secondary | ICD-10-CM | POA: Insufficient documentation

## 2019-09-28 DIAGNOSIS — C7989 Secondary malignant neoplasm of other specified sites: Secondary | ICD-10-CM | POA: Insufficient documentation

## 2019-09-28 LAB — SURGICAL PATHOLOGY

## 2019-09-28 NOTE — Consult Note (Signed)
NEW PATIENT EVALUATION  Name: Angelica Black  MRN: :[id number]  Date:   09/28/2019     DOB: 1971-07-07   This 49 y.o. female patient presents to the clinic for initial evaluation of palliative radiation therapy to large anterior mediastinal mass involving the chest wall in patient with known history of renal cell carcinoma stage T3a NX resected in 2016 at [facility].  REFERRING PHYSICIAN: No ref. provider found  CHIEF COMPLAINT:  Chief Complaint  Patient presents with  . Cancer    Initial consultation    DIAGNOSIS: The encounter diagnosis was Mass of left chest wall.   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  She also has a cyst history in 2002 of cervical cancer status post LEEP/conization.  She recently presented to the emergency room with chest pain CT scan showed a large pleural and left chest wall mass in the anterior mediastinum with a root destruction of the anterior left second rib.  There is also an adjacent pleural and anterior mediastinal mass measuring least 5.1 cm in greatest dimension.  There is numerous bulky mediastinal bilateral hilar lymph nodes consistent with metastatic disease as well as numerous pulmonary nodules.  There is also a lytic lesion of the left aspect of the sternal body consistent with osseous metastatic disease.  She is undergone biopsy of the chest mass although pathology is pending.  She is currently on narcotic analgesics for pain.  I been asked to evaluate her for possible palliative radiation therapy to the large left anterior chest wall mass.  PLANNED TREATMENT REGIMEN: Palliative radiation therapy to left anterior chest wall mass  PAST MEDICAL HISTORY:  has a past medical history of Cervical cancer ([place]) and Renal cell carcinoma ([place]) (2016).    PAST SURGICAL HISTORY:   Past Surgical History:  Procedure Laterality Date  . CERVICAL BIOPSY  W/ LOOP ELECTRODE EXCISION    . PARTIAL NEPHRECTOMY Left 07/08/2015   robotic laparoscopic nephrectomy at [place]: family history includes Hypertension in her father.  SOCIAL HISTORY:  reports that she has been smoking cigarettes. She has a 35.00 pack-year smoking history. She has never used smokeless tobacco. She reports current alcohol use.  ALLERGIES: Patient has no known allergies.  MEDICATIONS:  Current Outpatient Medications  Medication Sig Dispense Refill  . Aspirin-Salicylamide-Caffeine (BC HEADACHE POWDER PO) Take 1 packet by mouth 3 (three) times daily.    . chlorpheniramine-HYDROcodone (TUSSIONEX) 10-8 MG/5ML SUER Take 5 mLs by mouth at bedtime as needed for cough. 140 mL 0  . citalopram (CELEXA) 20 MG tablet Take 1 tablet (20 mg total) by mouth daily. 30 tablet 3  . morphine (MS CONTIN) 30 MG 12 hr tablet Take 1 tablet (30 mg total) by mouth every 12 (twelve) hours. 30 tablet 0  . oxyCODONE-acetaminophen (PERCOCET) 10-325 MG tablet Take 1 tablet by mouth every 8 (eight) hours as needed for pain. 45 tablet 0   No current facility-administered medications for this encounter.    ECOG PERFORMANCE STATUS:  2 - Symptomatic, <50% confined to bed  REVIEW OF SYSTEMS: Except for the chest wall pain and tenderness Patient denies any weight loss, fatigue, weakness, fever, chills or night sweats. Patient denies any loss of vision, blurred vision. Patient denies any ringing  of the ears or hearing loss. No  irregular heartbeat. Patient denies heart murmur or history of fainting. Patient denies any chest pain or pain radiating to her upper extremities. Patient denies any shortness of breath, difficulty breathing at night, cough or hemoptysis. Patient denies any swelling in the lower legs. Patient denies any nausea vomiting, vomiting of blood, or coffee ground material in the vomitus. Patient denies any  stomach pain. Patient states has had normal bowel movements no significant constipation or diarrhea. Patient denies any dysuria, hematuria or significant nocturia. Patient denies any problems walking, swelling in the joints or loss of balance. Patient denies any skin changes, loss of hair or loss of weight. Patient denies any excessive worrying or anxiety or significant depression. Patient denies any problems with insomnia. Patient denies excessive thirst, polyuria, polydipsia. Patient denies any swollen glands, patient denies easy bruising or easy bleeding. Patient denies any recent infections, allergies or URI. Patient "s visual fields have not changed significantly in recent time.   PHYSICAL EXAM: BP 101/61 (BP Location: Right Arm, Patient Position: Sitting)   Pulse 91   Resp 16   Wt 143 lb (64.9 kg)   LMP  (LMP Unknown)   BMI 21.74 kg/m  Patient exquisitely tender in the left anterior chest wall.  Range of motion of her upper extremities does not elicit pain.  Well-developed well-nourished patient in NAD. HEENT reveals PERLA, EOMI, discs not visualized.  Oral cavity is clear. No oral mucosal lesions are identified. Neck is clear without evidence of cervical or supraclavicular adenopathy. Lungs are clear to A&P. Cardiac examination is essentially unremarkable with regular rate and rhythm without murmur rub or thrill. Abdomen is benign with no organomegaly or masses noted. Motor sensory and DTR levels are equal and symmetric in the upper and lower extremities. Cranial nerves II through XII are grossly intact. Proprioception is intact. No peripheral adenopathy or edema is identified. No motor or sensory levels are noted. Crude visual fields are within normal range.  LABORATORY DATA: Previous pathology report reviewed    RADIOLOGY RESULTS: CT scans reviewed compatible with above-stated findings   IMPRESSION: Probable widespread renal cell carcinoma in 49 year old female presenting with intense pain  in left anterior chest wall secondary to metastatic disease in 49 year old female  PLAN: At this time I am going to go ahead with treatment planning for palliative course of radiation therapy to her left anterior chest wall encompassing much viable tumors possible.  I would plan delivering 3000 cGy in 10 fractions and evaluate for response.  Risks and benefits of treatment including skin reaction fatigue possible probable loss of normal lung volume possible slight radiation esophagitis all were discussed in detail with the patient.  She seems to comprehend my treatment plan well.  I still would like to review her pathology prior to beginning treatment.  I have personally set up and ordered CT simulation for later this week.  Patient comprehends my treatment plan well.  I would like to take this opportunity to thank you for allowing me to participate in the care of your patient.Noreene Filbert, MD

## 2019-09-29 NOTE — Progress Notes (Signed)
Patient is coming in for follow up she is doing ok no complaints.

## 2019-09-30 ENCOUNTER — Ambulatory Visit (HOSPITAL_COMMUNITY): Payer: Medicaid Other

## 2019-09-30 ENCOUNTER — Telehealth: Payer: Self-pay | Admitting: Pharmacist

## 2019-09-30 ENCOUNTER — Inpatient Hospital Stay: Payer: Self-pay

## 2019-09-30 ENCOUNTER — Inpatient Hospital Stay: Payer: Self-pay | Attending: Internal Medicine

## 2019-09-30 ENCOUNTER — Encounter: Payer: Self-pay | Admitting: Internal Medicine

## 2019-09-30 ENCOUNTER — Other Ambulatory Visit: Payer: Self-pay

## 2019-09-30 ENCOUNTER — Other Ambulatory Visit (HOSPITAL_COMMUNITY): Payer: Medicaid Other

## 2019-09-30 ENCOUNTER — Inpatient Hospital Stay (HOSPITAL_BASED_OUTPATIENT_CLINIC_OR_DEPARTMENT_OTHER): Payer: Self-pay | Admitting: Internal Medicine

## 2019-09-30 VITALS — BP 92/64 | HR 99 | Temp 97.8°F | Wt 143.0 lb

## 2019-09-30 DIAGNOSIS — C7800 Secondary malignant neoplasm of unspecified lung: Secondary | ICD-10-CM | POA: Insufficient documentation

## 2019-09-30 DIAGNOSIS — C7951 Secondary malignant neoplasm of bone: Secondary | ICD-10-CM | POA: Insufficient documentation

## 2019-09-30 DIAGNOSIS — C642 Malignant neoplasm of left kidney, except renal pelvis: Secondary | ICD-10-CM

## 2019-09-30 DIAGNOSIS — D649 Anemia, unspecified: Secondary | ICD-10-CM | POA: Insufficient documentation

## 2019-09-30 DIAGNOSIS — R0789 Other chest pain: Secondary | ICD-10-CM | POA: Insufficient documentation

## 2019-09-30 DIAGNOSIS — R222 Localized swelling, mass and lump, trunk: Secondary | ICD-10-CM | POA: Insufficient documentation

## 2019-09-30 DIAGNOSIS — Z8541 Personal history of malignant neoplasm of cervix uteri: Secondary | ICD-10-CM | POA: Insufficient documentation

## 2019-09-30 DIAGNOSIS — R630 Anorexia: Secondary | ICD-10-CM | POA: Insufficient documentation

## 2019-09-30 DIAGNOSIS — Z5112 Encounter for antineoplastic immunotherapy: Secondary | ICD-10-CM | POA: Insufficient documentation

## 2019-09-30 DIAGNOSIS — G478 Other sleep disorders: Secondary | ICD-10-CM | POA: Insufficient documentation

## 2019-09-30 DIAGNOSIS — F17210 Nicotine dependence, cigarettes, uncomplicated: Secondary | ICD-10-CM | POA: Insufficient documentation

## 2019-09-30 DIAGNOSIS — Z452 Encounter for adjustment and management of vascular access device: Secondary | ICD-10-CM

## 2019-09-30 DIAGNOSIS — Z79899 Other long term (current) drug therapy: Secondary | ICD-10-CM | POA: Insufficient documentation

## 2019-09-30 DIAGNOSIS — M25551 Pain in right hip: Secondary | ICD-10-CM | POA: Insufficient documentation

## 2019-09-30 DIAGNOSIS — R05 Cough: Secondary | ICD-10-CM | POA: Insufficient documentation

## 2019-09-30 DIAGNOSIS — Z7982 Long term (current) use of aspirin: Secondary | ICD-10-CM | POA: Insufficient documentation

## 2019-09-30 DIAGNOSIS — C7931 Secondary malignant neoplasm of brain: Secondary | ICD-10-CM | POA: Insufficient documentation

## 2019-09-30 DIAGNOSIS — R531 Weakness: Secondary | ICD-10-CM

## 2019-09-30 DIAGNOSIS — Z515 Encounter for palliative care: Secondary | ICD-10-CM | POA: Insufficient documentation

## 2019-09-30 DIAGNOSIS — R634 Abnormal weight loss: Secondary | ICD-10-CM | POA: Insufficient documentation

## 2019-09-30 LAB — BASIC METABOLIC PANEL
Anion gap: 10 (ref 5–15)
BUN: 14 mg/dL (ref 6–20)
CO2: 21 mmol/L — ABNORMAL LOW (ref 22–32)
Calcium: 8.6 mg/dL — ABNORMAL LOW (ref 8.9–10.3)
Chloride: 104 mmol/L (ref 98–111)
Creatinine, Ser: 0.78 mg/dL (ref 0.44–1.00)
GFR calc Af Amer: >60 mL/min (ref >60)
GFR calc non Af Amer: >60 mL/min (ref >60)
Glucose, Bld: 97 mg/dL (ref 70–99)
Potassium: 4.1 mmol/L (ref 3.5–5.1)
Sodium: 135 mmol/L (ref 135–145)

## 2019-09-30 LAB — CBC WITH DIFFERENTIAL/PLATELET
Abs Immature Granulocytes: 0.02 10*3/uL (ref 0.00–0.07)
Basophils Absolute: 0.1 10*3/uL (ref 0.0–0.1)
Basophils Relative: 1 %
Eosinophils Absolute: 0 10*3/uL (ref 0.0–0.5)
Eosinophils Relative: 0 %
HCT: 33 % — ABNORMAL LOW (ref 36.0–46.0)
Hemoglobin: 10 g/dL — ABNORMAL LOW (ref 12.0–15.0)
Immature Granulocytes: 0 %
Lymphocytes Relative: 22 %
Lymphs Abs: 1.5 10*3/uL (ref 0.7–4.0)
MCH: 28.2 pg (ref 26.0–34.0)
MCHC: 30.3 g/dL (ref 30.0–36.0)
MCV: 93.2 fL (ref 80.0–100.0)
Monocytes Absolute: 0.6 10*3/uL (ref 0.1–1.0)
Monocytes Relative: 8 %
Neutro Abs: 4.8 10*3/uL (ref 1.7–7.7)
Neutrophils Relative %: 69 %
Platelets: 387 10*3/uL (ref 150–400)
RBC: 3.54 MIL/uL — ABNORMAL LOW (ref 3.87–5.11)
RDW: 14.8 % (ref 11.5–15.5)
WBC: 7 10*3/uL (ref 4.0–10.5)
nRBC: 0 % (ref 0.0–0.2)

## 2019-09-30 MED ORDER — MORPHINE SULFATE ER 30 MG PO TBCR: 30.0000 mg | EXTENDED_RELEASE_TABLET | Freq: Three times a day (TID) | ORAL | 0 refills | Status: DC

## 2019-09-30 MED ORDER — OXYCODONE-ACETAMINOPHEN 10-325 MG PO TABS: 1.0000 | ORAL_TABLET | Freq: Three times a day (TID) | ORAL | 0 refills | Status: DC | PRN

## 2019-09-30 MED ORDER — AXITINIB 5 MG PO TABS: 5.0000 mg | ORAL_TABLET | Freq: Two times a day (BID) | ORAL | 4 refills | Status: AC

## 2019-09-30 MED ORDER — AXITINIB 5 MG PO TABS: 5.0000 mg | ORAL_TABLET | Freq: Two times a day (BID) | ORAL | 4 refills | Status: DC

## 2019-09-30 NOTE — Telephone Encounter (Signed)
Oral Oncology Pharmacist Encounter  Received new prescription for Inlyta (axitinib) for the treatment of metastatic RCC in conjunction with pembrolizumab, planned duration until disease progression or unacceptable drug toxicity.  CMP ordered assess when completed for hepatic impairment. Prescription dose and frequency assessed. Recommend checking a UA to evaluate for proteinuria. BP from 09/30/19 assess, BP well controlled, continue to monitor.   Current medication list in Epic reviewed, no DDIs with axitinib identified.  Prescription has been e-scribed to the [facility] for benefits analysis and approval.  Oral Oncology Clinic will continue to follow for insurance authorization, copayment issues, initial counseling and start date.  Darl Pikes, PharmD, BCPS, [facility] Hematology/Oncology Clinical Pharmacist [facility] [phone]  09/30/2019 9:59 AM

## 2019-09-30 NOTE — Assessment & Plan Note (Addendum)
#  Metastatic renal cell carcinoma-stage IV [lung nodules chest wall mass]; poor risk. Recommend MRI brain; CT abdomen pelvis for further evaluation.  #Discussed treatments are palliative not curative.  Discussed median life expectancy is in a between 1 to 2 years.  Given the borderline performance status significant disease burden-I would recommend combination of Keytruda plus Axi.  Discussed the potential side effects of axitinib including-elevated blood pressure diarrhea fatigue.   #  I discussed the mechanism of action; Length of treatments are likely ongoing/based upon the results of the scans. Discussed the potential side effects of immunotherapy including but not limited to diarrhea; skin rash; elevated LFTs/endocrine abnormalities etc.   #Left chest wall pain-likely malignancy.  Increase MS Contin to 30 mg 3 times a day.  New prescription givenPercocet 10 mg every 8 hours.  Refill again.  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  Hold Zometa.  # Palliative care evaluation: Introduced palliative care philosophy and services. I discussed the need for palliative care evaluation/symptom management to help quality of life in the context of incurable disease.  Patient interested; will make referral.  I spoke at length with the patient's friend, Velna Hatchet-  regarding the patient's clinical status/plan of care.   # DISPOSITION: # HOLD Zometa today # plan MRI brain/CT A/P; skeletal survey.  # palliative care referral # chemo ed asap # portreferral # 1 week- MD; labs- cbc/CMP/TSH; Beryle Flock- Dr.B  # I reviewed the blood work- with the patient in detail; also reviewed the imaging independently [as summarized above]; and with the patient in detail.    # 40 minutes face-to-face with the patient discussing the above plan of care; more than 50% of time spent on prognosis/ natural history; counseling and coordination.

## 2019-09-30 NOTE — Progress Notes (Signed)
PSN called patient on Thursday, 09/24/19 to discuss her needs. Patient stated that she was at the hospital for a scan, so she asked that PSN call her on Friday, 09/25/19.  PSN called patient on 09/25/19, but had to leave a voice mail message, asking that she return the call.  To date, PSN has not received a return call.

## 2019-09-30 NOTE — Progress Notes (Signed)
[facility] CONSULT NOTE  Patient Care Team: Patient, No Pcp Per as PCP - General (General Practice)  CHIEF COMPLAINTS/PURPOSE OF CONSULTATION: Lung mass/history of kidney cancer#   Oncology History Overview Note  # DEC 2020- left chest wall mass; bil mediatisnal LN; bil Lung nodules.   #  2016[facility] ]Renal cell carcinoma, clear cell type, Furhman grade 2 measuring 6.4. It extended into major veins. Negative margins. 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 [CT chetst wall Bx]; Poor risk [anemia;PS;hypercalcemia]  # JAN 2021-nKey+Axi  # 2002- cervical cancer LEEP/conization; no chemo-RT  # dec 2020- Hypercalcemia- Ca- 12; Zometa  # NGS/MOLECULAR TESTS: pending.   # PALLIATIVE CARE EVALUATION:P  # PAIN MANAGEMENT: P  DIAGNOSIS: RCC  STAGE:   IV      ;  GOALS: palliative  CURRENT/MOST RECENT THERAPY : key+Axi    Cancer of left kidney ([place])  09/17/2019 Initial Diagnosis   Cancer of left kidney ([place])   09/30/2019 -  Chemotherapy   The patient had PEMBROLIZUMAB CHEMO IV INFUSION, 200 mg, Intravenous, Once, 0 of 6 cycles  for chemotherapy treatment.       HISTORY OF PRESENTING ILLNESS:  Angelica Black 49 y.o.  female with a history of kidney cancer stage II recently noted to have lung lesion/chest wall mass-patient here is here for follow-up.    In the interim patient had a chest wall biopsy /is here to review the results of the chest wall biopsy results.  Patient complains of some improvement of the pain but no resolution of the pain.  Patient states her pain is suboptimally controlled on MS Contin 30 mg twice a day; and Percocet.  Appetite is poor.  Denies any headaches.  No nausea vomiting.    Review of Systems  Constitutional: Positive for weight loss. Negative for chills, diaphoresis, fever and malaise/fatigue.  HENT: Negative for nosebleeds and sore throat.   Eyes: Negative for double  vision.  Respiratory: Positive for cough and shortness of breath. Negative for hemoptysis, sputum production and wheezing.   Cardiovascular: Positive for chest pain. Negative for palpitations, orthopnea and leg swelling.  Gastrointestinal: Negative for abdominal pain, blood in stool, constipation, diarrhea, heartburn, melena, nausea and vomiting.  Genitourinary: Negative for dysuria, frequency and urgency.  Musculoskeletal: Positive for back pain and joint pain.  Skin: Negative.  Negative for itching and rash.  Neurological: Positive for tingling. Negative for dizziness, focal weakness, weakness and headaches.  Endo/Heme/Allergies: Does not bruise/bleed easily.  Psychiatric/Behavioral: Negative for depression. The patient is not nervous/anxious and does not have insomnia.      MEDICAL HISTORY:  Past Medical History:  Diagnosis Date  . Cervical cancer ([place])   . Renal cell carcinoma ([place]) 2016   Pathologic stage from 07/08/2015: Stage Unknown (T3a, NX, cM0) - Signed by Darin Engels, NP on 11/25/2015    SURGICAL HISTORY: Past Surgical History:  Procedure Laterality Date  . CERVICAL BIOPSY  W/ LOOP ELECTRODE EXCISION    . PARTIAL NEPHRECTOMY Left 07/08/2015   robotic laparoscopic nephrectomy at [facility]: Social History   Socioeconomic History  . Marital status: Divorced    Spouse name: Not on file  . Number of children: Not on file  . Years of education: Not on file  . Highest education level: Not on file  Occupational History  . Not on file  Tobacco Use  . Smoking status: Current Every  Day Smoker    Packs/day: 1.00    Years: 35.00    Pack years: 35.00    Types: Cigarettes  . Smokeless tobacco: Never Used  Substance and Sexual Activity  . Alcohol use: Yes    Comment: ocassional   . Drug use: Not on file  . Sexual activity: Not on file  Other Topics Concern  . 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 Difficulty of Paying Living Expenses: Not on file  Food Insecurity:   . Worried About Charity fundraiser in the Last Year: Not on file  . Ran Out of Food in the Last Year: Not on file  Transportation Needs:   . Lack of Transportation (Medical): Not on file  . Lack of Transportation (Non-Medical): Not on file  Physical Activity:   . Days of Exercise per Week: Not on file  . Minutes of Exercise per Session: Not on file  Stress:   . Feeling of Stress : Not on file  Social Connections:   . Frequency of Communication with Friends and Family: Not on file  . Frequency of Social Gatherings with Friends and Family: Not on file  . Attends Religious Services: Not on file  . Active Member of Clubs or Organizations: Not on file  . Attends Archivist Meetings: Not on file  . Marital Status: Not on file  Intimate Partner Violence:   . Fear of Current or Ex-Partner: Not on file  . Emotionally Abused: Not on file  . Physically Abused: Not on file  . Sexually Abused: Not on file    FAMILY HISTORY: Family History  Problem Relation Age of Onset  . Hypertension Father     ALLERGIES:  has No Known Allergies.  MEDICATIONS:  Current Outpatient Medications  Medication Sig Dispense Refill  . Aspirin-Salicylamide-Caffeine (BC HEADACHE POWDER PO) Take 1 packet by mouth 3 (three) times daily.    . chlorpheniramine-HYDROcodone (TUSSIONEX) 10-8 MG/5ML SUER Take 5 mLs by mouth at bedtime as needed for cough. 140 mL 0  . citalopram (CELEXA) 20 MG tablet Take 1 tablet (20 mg total) by mouth daily. 30 tablet 3  . morphine (MS CONTIN) 30 MG 12 hr tablet Take 1 tablet (30 mg total) by mouth every 8 (eight) hours. 30 tablet 0  . oxyCODONE-acetaminophen (PERCOCET) 10-325 MG tablet Take 1 tablet by mouth every 8 (eight) hours as  needed for pain. 45 tablet 0  . axitinib (INLYTA) 5 MG tablet Take 1 tablet (5 mg total) by mouth every 12 (twelve) hours. 60 tablet 4   No current facility-administered medications for this visit.      Marland Kitchen  PHYSICAL EXAMINATION: ECOG PERFORMANCE STATUS: 1 - Symptomatic but completely ambulatory  Vitals:   09/30/19 0856  BP: 92/64  Pulse: 99  Temp: 97.8 F (36.6 C)   Filed Weights   09/30/19 0856  Weight: 143 lb (64.9 kg)    Physical Exam  Constitutional: She is oriented to person, place, and time and well-developed, well-nourished, and in no distress.  Thin built female patient.  She is accompanied by her friend.  She is in a wheelchair.  HENT:  Head: Normocephalic and atraumatic.  Mouth/Throat: Oropharynx is clear and moist. No oropharyngeal exudate.  Eyes: Pupils are equal,  round, and reactive to light.  Cardiovascular: Normal rate and regular rhythm.  Pulmonary/Chest: No respiratory distress. She has no wheezes.  Decreased air entry bilaterally.  Abdominal: Soft. Bowel sounds are normal. She exhibits no distension and no mass. There is no abdominal tenderness. There is no rebound and no guarding.  Musculoskeletal:        General: No tenderness or edema. Normal range of motion.     Cervical back: Normal range of motion and neck supple.  Neurological: She is alert and oriented to person, place, and time.  Skin: Skin is warm.  Vague mass noted in the left chest wall region.  Tender to touch.  Psychiatric: Affect normal.  Anxious.    LABORATORY DATA:  I have reviewed the data as listed Lab Results  Component Value Date   WBC 7.0 09/30/2019   HGB 10.0 (L) 09/30/2019   HCT 33.0 (L) 09/30/2019   MCV 93.2 09/30/2019   PLT 387 09/30/2019   Recent Labs    09/16/19 1403 09/30/19 0837  NA 136 135  K 4.2 4.1  CL 100 104  CO2 26 21*  GLUCOSE 105* 97  BUN 13 14  CREATININE 0.90 0.78  CALCIUM 12.1* 8.6*  GFRNONAA >60 >60  GFRAA >60 >60    RADIOGRAPHIC  STUDIES: I have personally reviewed the radiological images as listed and agreed with the findings in the report. DG Chest 2 View  Result Date: 09/16/2019 CLINICAL DATA:  Seizure.  Chest pain and shortness of breath EXAM: CHEST - 2 VIEW COMPARISON:  None. FINDINGS: There are multiple nodular opacities throughout the lungs ranging in size from as small as 4 mm to as large as 2.5 by 2.5 cm. No edema or consolidation. Heart size and pulmonary vascularity are normal. There is equivocal fullness in the left hilum, potentially representing adenopathy. Evidence of old fracture of the lateral left clavicle. No well-defined blastic or lytic bone lesions. No pneumothorax. IMPRESSION: Widespread pulmonary nodular opacities. Appearance suspicious for metastatic disease. Question a degree of hilar adenopathy. It may be prudent to correlate with contrast enhanced chest CT given these findings. No edema or consolidation.  Heart size normal. Electronically Signed   By: Lowella Grip III M.D.   On: 09/16/2019 14:25   CT Angio Chest PE W and/or Wo Contrast  Result Date: 09/16/2019 CLINICAL DATA:  Pleuritic chest pain, abnormal chest x-ray EXAM: CT ANGIOGRAPHY CHEST WITH CONTRAST TECHNIQUE: Multidetector CT imaging of the chest was performed using the standard protocol during bolus administration of intravenous contrast. Multiplanar CT image reconstructions and MIPs were obtained to evaluate the vascular anatomy. CONTRAST:  68mL OMNIPAQUE IOHEXOL 350 MG/ML SOLN COMPARISON:  Same day chest radiograph FINDINGS: Cardiovascular: Satisfactory opacification of the pulmonary arteries to the segmental level. No evidence of pulmonary embolism. Normal heart size. Left coronary artery calcifications. No pericardial effusion. Mediastinum/Nodes: Numerous bulky mediastinal and bilateral hilar lymph nodes. Largest subcarinal node measures 3.5 x 2.6 cm (series 5, image 142). There is an enlarged superior mediastinal/paraesophageal lymph  node which exerts mass effect on the right aspect of the esophagus measuring approximately 2.3 x 1.7 cm (series 5, image 29). There is a large pleural and anterior mediastinal mass measuring at least 5.1 x 4.9 cm (series 5, image 120). Thyroid gland, trachea, and esophagus demonstrate no significant findings. Lungs/Pleura: Mild emphysema. There are numerous small pulmonary nodules throughout the lungs bilaterally and multiple left-sided pleural nodules and masses. There is a large pleural and chest wall mass of the anterior  left upper lobe, with overt destruction of the anterior left second rib, measuring at least 6.9 x 4.6 cm (series 5, image 87). No pleural effusion or pneumothorax. Upper Abdomen: No acute abnormality. Musculoskeletal: No chest wall abnormality. Destructive soft tissue masses involving the anterior left ribs as detailed above. Additional lytic lesions of the left aspect of the sternal body (series 6, image 34, 45). Review of the MIP images confirms the above findings. IMPRESSION: 1. Negative examination for pulmonary embolism. 2. Large pleural and chest wall mass of the anterior left upper lobe, with overt destruction of the anterior left second rib. 3. There is a large adjacent pleural and anterior mediastinal mass measuring at least 5.1 x 4.9 cm (series 5, image 120). 4. Numerous bulky mediastinal and bilateral hilar lymph nodes, consistent with metastatic disease. 5. Numerous pulmonary nodules. 6. Additional lytic lesions of the left aspect of the sternal body, consistent with additional osseous involvement, likely due to direct extension from adjacent masses. 7. There is an enlarged superior mediastinal/paraesophageal lymph node which exerts mass effect on the right aspect of the esophagus measuring approximately 2.3 x 1.7 cm (series 5, image 29). This may be specifically symptomatic due to location. Correlate with dysphagia. 8. Emphysema (ICD10-J43.9). Electronically Signed   By: Eddie Candle  M.D.   On: 09/16/2019 17:27   CT Biopsy  Result Date: 09/24/2019 INDICATION: Large anterior left chest wall mediastinal mass EXAM: CT biopsy left anterior chest wall mass MEDICATIONS: 1% lidocaine local ANESTHESIA/SEDATION: Moderate (conscious) sedation was employed during this procedure. A total of Versed 2.0 mg and Fentanyl 100 mcg was administered intravenously. Moderate Sedation Time: 11 minutes. The patient's level of consciousness and vital signs were monitored continuously by radiology nursing throughout the procedure under my direct supervision. FLUOROSCOPY TIME:  Fluoroscopy Time: None. COMPLICATIONS: None immediate. PROCEDURE: Informed written consent was obtained from the patient after a thorough discussion of the procedural risks, benefits and alternatives. All questions were addressed. Maximal Sterile Barrier Technique was utilized including caps, mask, sterile gowns, sterile gloves, sterile drape, hand hygiene and skin antiseptic. A timeout was performed prior to the initiation of the procedure. Previous imaging reviewed. Patient positioned supine. Noncontrast localization CT performed. The anterior left chest wall invasive mass was localized and marked. Under sterile conditions and local anesthesia, a 17 gauge coaxial guide was advanced from an anterior parasternal approach to the lesion. Needle position confirmed with CT. 18 gauge core biopsies obtained. Samples were intact and non fragmented. These were placed in formalin. Needle removed. Postprocedure imaging demonstrates no hemorrhage or hematoma. Patient tolerated the biopsy IMPRESSION: Successful CT-guided left anterior chest wall invasive mass 18 gauge core biopsies Electronically Signed   By: Jerilynn Mages.  Shick M.D.   On: 09/24/2019 11:41    ASSESSMENT & PLAN:   Cancer of left kidney ([place]) #Metastatic renal cell carcinoma-stage IV [lung nodules chest wall mass]; poor risk. Recommend MRI brain; CT abdomen pelvis for further  evaluation.  #Discussed treatments are palliative not curative.  Discussed median life expectancy is in a between 1 to 2 years.  Given the borderline performance status significant disease burden-I would recommend combination of Keytruda plus Axi.  Discussed the potential side effects of axitinib including-elevated blood pressure diarrhea fatigue.   #  I discussed the mechanism of action; Length of treatments are likely ongoing/based upon the results of the scans. Discussed the potential side effects of immunotherapy including but not limited to diarrhea; skin rash; elevated LFTs/endocrine abnormalities etc.   #Left chest wall  pain-likely malignancy.  Increase MS Contin to 30 mg 3 times a day.  New prescription givenPercocet 10 mg every 8 hours.  Refill again.  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  Hold Zometa.  # Palliative care evaluation: Introduced palliative care philosophy and services. I discussed the need for palliative care evaluation/symptom management to help quality of life in the context of incurable disease.  Patient interested; will make referral.  I spoke at length with the patient's friend, Velna Hatchet-  regarding the patient's clinical status/plan of care.   # DISPOSITION: # HOLD Zometa today # plan MRI brain/CT A/P; skeletal survey.  # palliative care referral # chemo ed asap # portreferral # 1 week- MD; labs- cbc/CMP/TSH; Beryle Flock- Dr.B  # I reviewed the blood work- with the patient in detail; also reviewed the imaging independently [as summarized above]; and with the patient in detail.    # 40 minutes face-to-face with the patient discussing the above plan of care; more than 50% of time spent on prognosis/ natural history; counseling and coordination.   All questions were answered. The patient knows to call the clinic with any problems, questions or concerns.    Cammie Sickle, MD 09/30/2019 12:59  PM

## 2019-09-30 NOTE — Progress Notes (Signed)
START ON PATHWAY REGIMEN - Renal Cell     A cycle is every 21 days:     Axitinib      Pembrolizumab   **Always confirm dose/schedule in your pharmacy ordering system**  Patient Characteristics: Stage IV/Metastatic Disease, Clear Cell, First Line, Intermediate or Poor Risk Therapeutic Status: Stage IV/Metastatic Disease Histology: Clear Cell Line of Therapy: First Line Risk Status: Poor Risk Intent of Therapy: Non-Curative / Palliative Intent, Discussed with Patient 

## 2019-10-01 ENCOUNTER — Telehealth: Payer: Self-pay | Admitting: Pharmacy Technician

## 2019-10-01 ENCOUNTER — Ambulatory Visit: Payer: Self-pay

## 2019-10-01 ENCOUNTER — Other Ambulatory Visit: Payer: Self-pay | Admitting: Radiology

## 2019-10-01 NOTE — Telephone Encounter (Signed)
Oral Oncology Patient Advocate Encounter  Met patient in room on 03/14/62 to complete application for [facility] in an effort to reduce patient's out of pocket expense for Inlyta to $0.    Patient currently has [facility], which does not cover any oral chemotherapy medication.  Application completed and faxed to [phone] on 10/01/19.   [employer] patient assistance phone number for follow up is [phone].   This encounter will be updated until final determination.   [place] Patient [place] Phone [phone] Fax [phone] 10/01/2019 2:46 PM

## 2019-10-02 ENCOUNTER — Inpatient Hospital Stay (HOSPITAL_BASED_OUTPATIENT_CLINIC_OR_DEPARTMENT_OTHER): Payer: Self-pay | Admitting: Hospice and Palliative Medicine

## 2019-10-02 ENCOUNTER — Other Ambulatory Visit: Payer: Self-pay

## 2019-10-02 ENCOUNTER — Ambulatory Visit
Admission: RE | Admit: 2019-10-02 | Discharge: 2019-10-02 | Disposition: A | Discharge status: Home / Self Care | Payer: Self-pay | Source: Ambulatory Visit | Attending: Radiation Oncology | Admitting: Radiation Oncology

## 2019-10-02 DIAGNOSIS — Z510 Encounter for antineoplastic radiation therapy: Secondary | ICD-10-CM | POA: Insufficient documentation

## 2019-10-02 DIAGNOSIS — C7802 Secondary malignant neoplasm of left lung: Secondary | ICD-10-CM | POA: Insufficient documentation

## 2019-10-02 DIAGNOSIS — Z515 Encounter for palliative care: Secondary | ICD-10-CM

## 2019-10-02 DIAGNOSIS — C649 Malignant neoplasm of unspecified kidney, except renal pelvis: Secondary | ICD-10-CM | POA: Insufficient documentation

## 2019-10-02 DIAGNOSIS — C7801 Secondary malignant neoplasm of right lung: Secondary | ICD-10-CM | POA: Insufficient documentation

## 2019-10-02 DIAGNOSIS — C7931 Secondary malignant neoplasm of brain: Secondary | ICD-10-CM | POA: Insufficient documentation

## 2019-10-02 DIAGNOSIS — C7951 Secondary malignant neoplasm of bone: Secondary | ICD-10-CM | POA: Insufficient documentation

## 2019-10-02 NOTE — Patient Instructions (Signed)
Pembrolizumab injection What is this medicine? PEMBROLIZUMAB (pem broe liz ue mab) is a monoclonal antibody. It is used to treat certain types of cancer. This medicine may be used for other purposes; ask your health care provider or pharmacist if you have questions. COMMON BRAND NAME(S): Keytruda What should I tell my health care provider before I take this medicine? They need to know if you have any of these conditions:  diabetes  immune system problems  inflammatory bowel disease  liver disease  lung or breathing disease  lupus  received or scheduled to receive an organ transplant or a stem-cell transplant that uses donor stem cells  an unusual or allergic reaction to pembrolizumab, other medicines, foods, dyes, or preservatives  pregnant or trying to get pregnant  breast-feeding How should I use this medicine? This medicine is for infusion into a vein. It is given by a health care professional in a hospital or clinic setting. A special MedGuide will be given to you before each treatment. Be sure to read this information carefully each time. Talk to your pediatrician regarding the use of this medicine in children. While this drug may be prescribed for children as young as 6 months for selected conditions, precautions do apply. Overdosage: If you think you have taken too much of this medicine contact a poison control center or emergency room at once. NOTE: This medicine is only for you. Do not share this medicine with others. What if I miss a dose? It is important not to miss your dose. Call your doctor or health care professional if you are unable to keep an appointment. What may interact with this medicine? Interactions have not been studied. Give your health care provider a list of all the medicines, herbs, non-prescription drugs, or dietary supplements you use. Also tell them if you smoke, drink alcohol, or use illegal drugs. Some items may interact with your medicine. This  list may not describe all possible interactions. Give your health care provider a list of all the medicines, herbs, non-prescription drugs, or dietary supplements you use. Also tell them if you smoke, drink alcohol, or use illegal drugs. Some items may interact with your medicine. What should I watch for while using this medicine? Your condition will be monitored carefully while you are receiving this medicine. You may need blood work done while you are taking this medicine. Do not become pregnant while taking this medicine or for 4 months after stopping it. Women should inform their doctor if they wish to become pregnant or think they might be pregnant. There is a potential for serious side effects to an unborn child. Talk to your health care professional or pharmacist for more information. Do not breast-feed an infant while taking this medicine or for 4 months after the last dose. What side effects may I notice from receiving this medicine? Side effects that you should report to your doctor or health care professional as soon as possible:  allergic reactions like skin rash, itching or hives, swelling of the face, lips, or tongue  bloody or black, tarry  breathing problems  changes in vision  chest pain  chills  confusion  constipation  cough  diarrhea  dizziness or feeling faint or lightheaded  fast or irregular heartbeat  fever  flushing  joint pain  low blood counts - this medicine may decrease the number of white blood cells, red blood cells and platelets. You may be at increased risk for infections and bleeding.  muscle pain  muscle   weakness  pain, tingling, numbness in the hands or feet  persistent headache  redness, blistering, peeling or loosening of the skin, including inside the mouth  signs and symptoms of high blood sugar such as dizziness; dry mouth; dry skin; fruity breath; nausea; stomach pain; increased hunger or thirst; increased urination  signs  and symptoms of kidney injury like trouble passing urine or change in the amount of urine  signs and symptoms of liver injury like dark urine, light-colored stools, loss of appetite, nausea, right upper belly pain, yellowing of the eyes or skin  sweating  swollen lymph nodes  weight loss Side effects that usually do not require medical attention (report to your doctor or health care professional if they continue or are bothersome):  decreased appetite  hair loss  muscle pain  tiredness This list may not describe all possible side effects. Call your doctor for medical advice about side effects. You may report side effects to FDA at 1-800-FDA-1088. Where should I keep my medicine? This drug is given in a hospital or clinic and will not be stored at home. NOTE: This sheet is a summary. It may not cover all possible information. If you have questions about this medicine, talk to your doctor, pharmacist, or health care provider.  2020 Elsevier/Gold Standard (2019-07-10 18:07:58)  

## 2019-10-02 NOTE — Progress Notes (Signed)
Was unable to reach patient on the phone. Message left. Will schedule a visit when she returns to the clinic on 1/22.

## 2019-10-05 ENCOUNTER — Other Ambulatory Visit: Payer: Self-pay

## 2019-10-05 ENCOUNTER — Inpatient Hospital Stay (HOSPITAL_BASED_OUTPATIENT_CLINIC_OR_DEPARTMENT_OTHER): Payer: Self-pay | Admitting: Oncology

## 2019-10-05 ENCOUNTER — Telehealth: Payer: Self-pay | Admitting: Pharmacy Technician

## 2019-10-05 ENCOUNTER — Other Ambulatory Visit: Payer: Self-pay | Admitting: Internal Medicine

## 2019-10-05 ENCOUNTER — Ambulatory Visit
Admission: RE | Admit: 2019-10-05 | Discharge: 2019-10-05 | Disposition: A | Discharge status: Home / Self Care | Payer: Medicaid Other | Source: Ambulatory Visit | Attending: Internal Medicine | Admitting: Internal Medicine

## 2019-10-05 ENCOUNTER — Inpatient Hospital Stay: Payer: Self-pay

## 2019-10-05 DIAGNOSIS — C642 Malignant neoplasm of left kidney, except renal pelvis: Secondary | ICD-10-CM

## 2019-10-05 DIAGNOSIS — Z452 Encounter for adjustment and management of vascular access device: Secondary | ICD-10-CM

## 2019-10-05 DIAGNOSIS — M791 Myalgia, unspecified site: Secondary | ICD-10-CM | POA: Insufficient documentation

## 2019-10-05 DIAGNOSIS — F17210 Nicotine dependence, cigarettes, uncomplicated: Secondary | ICD-10-CM | POA: Insufficient documentation

## 2019-10-05 DIAGNOSIS — Z79899 Other long term (current) drug therapy: Secondary | ICD-10-CM | POA: Insufficient documentation

## 2019-10-05 HISTORY — PX: IR IMAGING GUIDED PORT INSERTION: IMG5740

## 2019-10-05 MED ORDER — SODIUM CHLORIDE 0.9 % IV SOLN: INTRAVENOUS | Status: DC

## 2019-10-05 MED ORDER — HYDROCOD POLST-CPM POLST ER 10-8 MG/5ML PO SUER: 5.0000 mL | Freq: Every evening | ORAL | 0 refills | Status: DC | PRN

## 2019-10-05 MED ORDER — HEPARIN SOD (PORK) LOCK FLUSH 100 UNIT/ML IV SOLN
INTRAVENOUS | Status: AC
  Filled 2019-10-05: qty 5

## 2019-10-05 MED ORDER — OXYCODONE-ACETAMINOPHEN 10-325 MG PO TABS: 1.0000 | ORAL_TABLET | Freq: Three times a day (TID) | ORAL | 0 refills | Status: DC | PRN

## 2019-10-05 MED ORDER — MIDAZOLAM HCL 5 MG/5ML IJ SOLN
INTRAMUSCULAR | Status: AC
  Filled 2019-10-05: qty 5

## 2019-10-05 MED ORDER — CEFAZOLIN SODIUM-DEXTROSE 2-4 GM/100ML-% IV SOLN
2.0000 g | INTRAVENOUS | Status: AC
  Administered 2019-10-05: 15:00:00 2 g via INTRAVENOUS

## 2019-10-05 MED ORDER — MIDAZOLAM HCL 2 MG/2ML IJ SOLN
INTRAMUSCULAR | Status: AC | PRN
  Administered 2019-10-05 (×3): 1 mg via INTRAVENOUS

## 2019-10-05 MED ORDER — FENTANYL CITRATE (PF) 100 MCG/2ML IJ SOLN
INTRAMUSCULAR | Status: AC
  Filled 2019-10-05: qty 4

## 2019-10-05 MED ORDER — FENTANYL CITRATE (PF) 100 MCG/2ML IJ SOLN
INTRAMUSCULAR | Status: AC | PRN
  Administered 2019-10-05 (×2): 50 ug via INTRAVENOUS

## 2019-10-05 MED FILL — INLYTA 5 MG TABLET: 5 | 30 days supply | Qty: 60 | Fill #0

## 2019-10-05 NOTE — Telephone Encounter (Signed)
Okay for refill of Tussionex 10-8 mg/28ml. Refill sent.  Faythe Casa, NP 10/05/2019 1:44 PM

## 2019-10-05 NOTE — Telephone Encounter (Signed)
Patient called cancer center requesting refill of Percocet 10/325mg  q 8 hours prn for pain.  As mandated by the [place] STOP Act (Strengthen Opioid Misuse Prevention), the [place] Controlled Substance Reporting System ([place]) was reviewed for this patient. Below is the past 34-months of controlled substance prescriptions as displayed by the registry. I have personally consulted with my supervising physician, Dr. Rogue Bussing, who agrees that continuation of opiate therapy is medically appropriate at this time and agrees to provide continual monitoring, including urine/blood drug screens, as indicated. Refill is appropriate on or after 10/04/18.  [place]CC[place] reviewed: Reviewed and acceptable.     Faythe Casa, NP 10/05/2019 1:41 PM [phone]

## 2019-10-05 NOTE — Progress Notes (Signed)
Patient clinically stable post Port placement per Dr Earleen Newport, tolerated well, vitals stable throughout procedure.awake/alert and oriented post procedure, received Versed 3mg  along with Fentanyl 134mcg IV for procedure. Patient plans to stop on way home and purchase her prescribed pain meds which she has been without since 1/15 pm. Report given to Catherine/Brooke RN in specials to finish recovery.

## 2019-10-05 NOTE — Procedures (Signed)
Interventional Radiology Procedure Note  Procedure: Placement of a right IJ approach single lumen PowerPort.  Tip is positioned at the superior cavoatrial junction and catheter is ready for immediate use.  Complications: None Recommendations:  - Ok to shower tomorrow - Do not submerge for 7 days - Routine line care   Signed,  Danel Requena S. Ancelmo Hunt, DO   

## 2019-10-05 NOTE — H&P (Signed)
Chief Complaint: Port Catheter  Referring Physician(s): Cammie Sickle  Supervising Physician: Corrie Mckusick  Patient Status: [facility] - Out-pt  History of Present Illness: Angelica Black is a 49 y.o. female presenting for port insertion.   She denies any new symptoms of cough, fever, rigors/chills.   She has typical complains of MSK pain.   Past Medical History:  Diagnosis Date  . Cervical cancer ([place])   . Renal cell carcinoma ([place]) 2016   Pathologic stage from 07/08/2015: Stage Unknown (T3a, NX, cM0) - Signed by Darin Engels, NP on 11/25/2015    Past Surgical History:  Procedure Laterality Date  . CERVICAL BIOPSY  W/ LOOP ELECTRODE EXCISION    . PARTIAL NEPHRECTOMY Left 07/08/2015   robotic laparoscopic nephrectomy at [facility]    Allergies: Patient has no known allergies.  Medications: Prior to Admission medications   Medication Sig Start Date End Date Taking? Authorizing Provider  Aspirin-Salicylamide-Caffeine (BC HEADACHE POWDER PO) Take 1 packet by mouth 3 (three) times daily.   Yes [provider]  axitinib (INLYTA) 5 MG tablet Take 1 tablet (5 mg total) by mouth every 12 (twelve) hours. 09/30/19  Yes Cammie Sickle, MD  citalopram (CELEXA) 20 MG tablet Take 1 tablet (20 mg total) by mouth daily. 09/17/19  Yes Cammie Sickle, MD  morphine (MS CONTIN) 30 MG 12 hr tablet Take 1 tablet (30 mg total) by mouth every 8 (eight) hours. 09/30/19  Yes Cammie Sickle, MD  chlorpheniramine-HYDROcodone (TUSSIONEX) 10-8 MG/5ML SUER Take 5 mLs by mouth at bedtime as needed for cough. 10/05/19   Jacquelin Hawking, NP  oxyCODONE-acetaminophen (PERCOCET) 10-325 MG tablet Take 1 tablet by mouth every 8 (eight) hours as needed for pain. 10/05/19   Jacquelin Hawking, NP     Family History  Problem Relation Age of Onset  . Hypertension Father     Social History   Socioeconomic History  . Marital status: Divorced    Spouse name: Not on file    . Number of children: Not on file  . Years of education: Not on file  . Highest education level: Not on file  Occupational History  . Not on file  Tobacco Use  . Smoking status: Current Every Day Smoker    Packs/day: 1.00    Years: 35.00    Pack years: 35.00    Types: Cigarettes  . Smokeless tobacco: Never Used  Substance and Sexual Activity  . Alcohol use: Yes    Comment: ocassional   . Drug use: Not on file  . Sexual activity: Not on file  Other Topics Concern  . 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 Difficulty of Paying Living Expenses: Not on file  Food Insecurity:   . Worried About Charity fundraiser in the Last Year: Not on file  . Ran Out of Food in the Last Year: Not on file  Transportation Needs:   . Lack of Transportation (Medical): Not on file  . Lack of Transportation (Non-Medical): Not on file  Physical Activity:   . Days of Exercise per Week: Not on file  . Minutes of Exercise per Session: Not on file  Stress:   . Feeling of Stress : Not on file  Social Connections:   .  Frequency of Communication with Friends and Family: Not on file  . Frequency of Social Gatherings with Friends and Family: Not on file  . Attends Religious Services: Not on file  . Active Member of Clubs or Organizations: Not on file  . Attends Archivist Meetings: Not on file  . Marital Status: Not on file       Review of Systems: A 12 point ROS discussed and pertinent positives are indicated in the HPI above.  All other systems are negative.  Review of Systems  Vital Signs: BP 109/63 (BP Location: Left Arm)   Pulse (!) 104   Temp 98.2 F (36.8 C) (Oral)   Resp (!) 21   Ht 5\' 8"  (1.727 m)   Wt 64.9 kg   LMP  (LMP Unknown)   SpO2 99%   BMI 21.74  kg/m   Physical Exam General: 49 yo female appearing stated age.  Well-developed, well-nourished.  No distress. HEENT: Atraumatic, normocephalic.  Conjugate gaze, extra-ocular motor intact. No scleral icterus or scleral injection. No lesions on external ears, nose, lips, or gums.  Oral mucosa moist, pink.  Neck: Symmetric with no goiter enlargement.  Chest/Lungs:  Symmetric chest with inspiration/expiration.  No labored breathing.  Clear to auscultation with no wheezes, rhonchi, or rales.  Heart:  RRR, with no third heart sounds appreciated. No JVD appreciated.  Abdomen:  Soft, NT/ND, with + bowel sounds.   Genito-urinary: Deferred Neurologic: Alert & Oriented to person, place, and time.   Normal affect and insight.  Appropriate questions.  Moving all 4 extremities with gross sensory intact.     Imaging: DG Chest 2 View  Result Date: 09/16/2019 CLINICAL DATA:  Seizure.  Chest pain and shortness of breath EXAM: CHEST - 2 VIEW COMPARISON:  None. FINDINGS: There are multiple nodular opacities throughout the lungs ranging in size from as small as 4 mm to as large as 2.5 by 2.5 cm. No edema or consolidation. Heart size and pulmonary vascularity are normal. There is equivocal fullness in the left hilum, potentially representing adenopathy. Evidence of old fracture of the lateral left clavicle. No well-defined blastic or lytic bone lesions. No pneumothorax. IMPRESSION: Widespread pulmonary nodular opacities. Appearance suspicious for metastatic disease. Question a degree of hilar adenopathy. It may be prudent to correlate with contrast enhanced chest CT given these findings. No edema or consolidation.  Heart size normal. Electronically Signed   By: Lowella Grip III M.D.   On: 09/16/2019 14:25   CT Angio Chest PE W and/or Wo Contrast  Result Date: 09/16/2019 CLINICAL DATA:  Pleuritic chest pain, abnormal chest x-ray EXAM: CT ANGIOGRAPHY CHEST WITH CONTRAST TECHNIQUE: Multidetector CT imaging of  the chest was performed using the standard protocol during bolus administration of intravenous contrast. Multiplanar CT image reconstructions and MIPs were obtained to evaluate the vascular anatomy. CONTRAST:  77mL OMNIPAQUE IOHEXOL 350 MG/ML SOLN COMPARISON:  Same day chest radiograph FINDINGS: Cardiovascular: Satisfactory opacification of the pulmonary arteries to the segmental level. No evidence of pulmonary embolism. Normal heart size. Left coronary artery calcifications. No pericardial effusion. Mediastinum/Nodes: Numerous bulky mediastinal and bilateral hilar lymph nodes. Largest subcarinal node measures 3.5 x 2.6 cm (series 5, image 142). There is an enlarged superior mediastinal/paraesophageal lymph node which exerts mass effect on the right aspect of the esophagus measuring approximately 2.3 x 1.7 cm (series 5, image 29). There is a large pleural and anterior mediastinal mass measuring at least 5.1 x 4.9 cm (series 5, image 120). Thyroid gland,  trachea, and esophagus demonstrate no significant findings. Lungs/Pleura: Mild emphysema. There are numerous small pulmonary nodules throughout the lungs bilaterally and multiple left-sided pleural nodules and masses. There is a large pleural and chest wall mass of the anterior left upper lobe, with overt destruction of the anterior left second rib, measuring at least 6.9 x 4.6 cm (series 5, image 87). No pleural effusion or pneumothorax. Upper Abdomen: No acute abnormality. Musculoskeletal: No chest wall abnormality. Destructive soft tissue masses involving the anterior left ribs as detailed above. Additional lytic lesions of the left aspect of the sternal body (series 6, image 34, 45). Review of the MIP images confirms the above findings. IMPRESSION: 1. Negative examination for pulmonary embolism. 2. Large pleural and chest wall mass of the anterior left upper lobe, with overt destruction of the anterior left second rib. 3. There is a large adjacent pleural and  anterior mediastinal mass measuring at least 5.1 x 4.9 cm (series 5, image 120). 4. Numerous bulky mediastinal and bilateral hilar lymph nodes, consistent with metastatic disease. 5. Numerous pulmonary nodules. 6. Additional lytic lesions of the left aspect of the sternal body, consistent with additional osseous involvement, likely due to direct extension from adjacent masses. 7. There is an enlarged superior mediastinal/paraesophageal lymph node which exerts mass effect on the right aspect of the esophagus measuring approximately 2.3 x 1.7 cm (series 5, image 29). This may be specifically symptomatic due to location. Correlate with dysphagia. 8. Emphysema (ICD10-J43.9). Electronically Signed   By: Eddie Candle M.D.   On: 09/16/2019 17:27   CT Biopsy  Result Date: 09/24/2019 INDICATION: Large anterior left chest wall mediastinal mass EXAM: CT biopsy left anterior chest wall mass MEDICATIONS: 1% lidocaine local ANESTHESIA/SEDATION: Moderate (conscious) sedation was employed during this procedure. A total of Versed 2.0 mg and Fentanyl 100 mcg was administered intravenously. Moderate Sedation Time: 11 minutes. The patient's level of consciousness and vital signs were monitored continuously by radiology nursing throughout the procedure under my direct supervision. FLUOROSCOPY TIME:  Fluoroscopy Time: None. COMPLICATIONS: None immediate. PROCEDURE: Informed written consent was obtained from the patient after a thorough discussion of the procedural risks, benefits and alternatives. All questions were addressed. Maximal Sterile Barrier Technique was utilized including caps, mask, sterile gowns, sterile gloves, sterile drape, hand hygiene and skin antiseptic. A timeout was performed prior to the initiation of the procedure. Previous imaging reviewed. Patient positioned supine. Noncontrast localization CT performed. The anterior left chest wall invasive mass was localized and marked. Under sterile conditions and local  anesthesia, a 17 gauge coaxial guide was advanced from an anterior parasternal approach to the lesion. Needle position confirmed with CT. 18 gauge core biopsies obtained. Samples were intact and non fragmented. These were placed in formalin. Needle removed. Postprocedure imaging demonstrates no hemorrhage or hematoma. Patient tolerated the biopsy IMPRESSION: Successful CT-guided left anterior chest wall invasive mass 18 gauge core biopsies Electronically Signed   By: Jerilynn Mages.  Shick M.D.   On: 09/24/2019 11:41    Labs:  CBC: Recent Labs    09/16/19 1403 09/24/19 0949 09/30/19 0837  WBC 6.8 7.5 7.0  HGB 11.2* 9.6* 10.0*  HCT 35.2* 30.3* 33.0*  PLT 320 327 387    COAGS: Recent Labs    09/24/19 0949  INR 1.0    BMP: Recent Labs    09/16/19 1403 09/30/19 0837  NA 136 135  K 4.2 4.1  CL 100 104  CO2 26 21*  GLUCOSE 105* 97  BUN 13 14  CALCIUM  12.1* 8.6*  CREATININE 0.90 0.78  GFRNONAA >60 >60  GFRAA >60 >60    LIVER FUNCTION TESTS: No results for input(s): BILITOT, AST, ALT, ALKPHOS, PROT, ALBUMIN in the last 8760 hours.  TUMOR MARKERS: No results for input(s): AFPTM, CEA, CA199, CHROMGRNA in the last 8760 hours.  Assessment and Plan:  Ms Hull presents for port catheter insertion.   Risks and benefits of image guided port-a-catheter placement was discussed with the patient including, but not limited to bleeding, infection, pneumothorax, or fibrin sheath development and need for additional procedures.  All of the patient's questions were answered, patient is agreeable to proceed. Consent signed and in chart.    Thank you for this interesting consult.  I greatly enjoyed meeting LIRIA PARCEL and look forward to participating in their care.  A copy of this report was sent to the requesting provider on this date.  Electronically Signed: Corrie Mckusick, DO 10/05/2019, 3:56 PM   I spent a total of  15 Minutes   in face to face in clinical consultation, greater than  50% of which was counseling/coordinating care for port insertion.

## 2019-10-05 NOTE — Telephone Encounter (Signed)
Oral Oncology Patient Advocate Encounter  Met patient in education room and spoke with her and her friend, Velna Hatchet, to set up free trial shipment of Inlyta. [facility] will be shipping a 30 day free trial to get the patient started while awaiting approval with Patient Assistance through [employer].  Medication will be shipped 1/18 for delivery on 1/19-20.  Pharmacist, Leron Croak, provided patient education for Inlyta.  [place] [facility] [place] Phone [phone] Fax [phone] 10/05/2019 12:09 PM

## 2019-10-05 NOTE — Telephone Encounter (Signed)
Oral Chemotherapy Pharmacist Encounter  I spoke with patient and patient's friend, [name], for overview of: Inlyta (axitinib) for the treatment of  metastatic renal cell carcinoma, in conjunction with pembrolizumab planned duration until disease progression or unacceptable toxicity.   Counseled patient on administration, dosing, side effects, monitoring, drug-food interactions, safe handling, storage, and disposal.  Patient will take Inlyta 5mg  tablets, 1 tablet by mouth twice daily, without regard to food, with a glass of water.  Patient instructed to avoid grapefruit and grapefruit juice while on therapy with Inlyta.   Inlyta start date: 10/08/18 (instructed to start on the same day as first pembrolizumab infusion)  Adverse effects include but are not limited to: hypertension, hand-foot syndrome, nausea, vomiting, diarrhea, fatigue, dysphonia (hoarseness), and abnormal laboratory values.   Patient will contact office if she has 4 or more loose stools per day.    Inlyta will be held at least 24 hours prior to surgery and resumed at discretion of treating physician based on wound healing  Reviewed with patient importance of keeping a medication schedule and plan for any missed doses.  Medication reconciliation performed and medication/allergy list updated.  Patient will receive first fill of Inlyta for $0 from [facility] as we await approval of patient assistance through [employer].   All questions answered.  Ms. Imani and [name] voiced understanding and appreciation.   Patient knows to call the office with questions or concerns.  Leron Croak, PharmD, [place] PGY2 Hematology/Oncology Pharmacy Resident 10/05/2019 11:59 AM Oral Oncology Clinic [phone]

## 2019-10-05 NOTE — Progress Notes (Signed)
[place]  Telephone:([phone] [phone]  Patient Care Team: Patient, No Pcp Per as PCP - General (General Practice)   Name of the patient: Angelica Black  [id number]  08-01-1971   Date of visit: 10/05/19  Diagnosis-lung mass/history of kidney cancer  Chief complaint/Reason for visit- Initial Meeting for [facility], preparing for starting chemotherapy  Heme/Onc history:  Oncology History Overview Note  # DEC 2020- left chest wall mass; bil mediatisnal LN; bil Lung nodules.   #  2016[facility] ]Renal cell carcinoma, clear cell type, Furhman grade 2 measuring 6.4. It extended into major veins. Negative margins. 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 [CT chetst wall Bx]; Poor risk [anemia;PS;hypercalcemia]  # JAN 2021-nKey+Axi  # 2002- cervical cancer LEEP/conization; no chemo-RT  # dec 2020- Hypercalcemia- Ca- 12; Zometa  # NGS/MOLECULAR TESTS: pending.   # PALLIATIVE CARE EVALUATION:P  # PAIN MANAGEMENT: P  DIAGNOSIS: RCC  STAGE:   IV      ;  GOALS: palliative  CURRENT/MOST RECENT THERAPY : key+Axi    Cancer of left kidney ([place])  09/17/2019 Initial Diagnosis   Cancer of left kidney ([place])   10/09/2019 -  Chemotherapy   The patient had pembrolizumab (KEYTRUDA) 200 mg in sodium chloride 0.9 % 50 mL chemo infusion, 200 mg, Intravenous, Once, 0 of 6 cycles  for chemotherapy treatment.      Interval history-Angelica Black is a 49 year old female who presents to chemo care clinic today for initial meeting in preparation for starting chemotherapy. I introduced the chemo care clinic and we discussed that the role of the clinic is to assist those who are at an increased risk of emergency room visits and/or complications during the course of chemotherapy treatment. We discussed that the increased risk takes into account factors such as age,  performance status, and co-morbidities. We also discussed that for some, this might include barriers to care such as not having a primary care provider, lack of insurance/transportation, or not being able to afford medications. We discussed that the goal of the program is to help prevent unplanned ER visits and help reduce complications during chemotherapy. We do this by discussing specific risk factors to each individual and identifying ways that we can help improve these risk factors and reduce barriers to care.   ECOG FS:2 - Symptomatic, <50% confined to bed  Review of systems- Review of Systems  Constitutional: Positive for malaise/fatigue. Negative for chills, fever and weight loss.  HENT: Negative for congestion, ear pain and tinnitus.   Eyes: Negative.  Negative for blurred vision and double vision.  Respiratory: Positive for cough and sputum production. Negative for shortness of breath.   Cardiovascular: Positive for chest pain. Negative for palpitations and leg swelling.  Gastrointestinal: Negative.  Negative for abdominal pain, constipation, diarrhea, nausea and vomiting.  Genitourinary: Negative for dysuria, frequency and urgency.  Musculoskeletal: Negative for back pain and falls.  Skin: Negative.  Negative for rash.  Neurological: Positive for weakness. Negative for headaches.  Endo/Heme/Allergies: Negative.  Does not bruise/bleed easily.  Psychiatric/Behavioral: Positive for memory loss. Negative for depression. The patient has insomnia. The patient is not nervous/anxious.     Current treatment- Keytruda + Inlyta with concurrent radiation  No Known Allergies  Past Medical History:  Diagnosis Date  . Cervical cancer ([place])   . Renal cell carcinoma ([place]) 2016   Pathologic stage from 07/08/2015:  Stage Unknown (T3a, NX, cM0) - Signed by Darin Engels, NP on 11/25/2015    Past Surgical History:  Procedure Laterality Date  . CERVICAL BIOPSY  W/ LOOP ELECTRODE EXCISION    .  PARTIAL NEPHRECTOMY Left 07/08/2015   robotic laparoscopic nephrectomy at [place] History  . Marital status: Divorced    Spouse name: Not on file  . Number of children: Not on file  . Years of education: Not on file  . Highest education level: Not on file  Occupational History  . Not on file  Tobacco Use  . Smoking status: Current Every Day Smoker    Packs/day: 1.00    Years: 35.00    Pack years: 35.00    Types: Cigarettes  . Smokeless tobacco: Never Used  Substance and Sexual Activity  . Alcohol use: Yes    Comment: ocassional   . Drug use: Not on file  . Sexual activity: Not on file  Other Topics Concern  . 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 Difficulty of Paying Living Expenses: Not on file  Food Insecurity:   . Worried About Charity fundraiser in the Last Year: Not on file  . Ran Out of Food in the Last Year: Not on file  Transportation Needs:   . Lack of Transportation (Medical): Not on file  . Lack of Transportation (Non-Medical): Not on file  Physical Activity:   . Days of Exercise per Week: Not on file  . Minutes of Exercise per Session: Not on file  Stress:   . Feeling of Stress : Not on file  Social Connections:   . Frequency of Communication with Friends and Family: Not on file  . Frequency of Social Gatherings with Friends and Family: Not on file  . Attends Religious Services: Not on file  . Active Member of Clubs or Organizations: Not on file  . Attends Archivist Meetings: Not on file  . Marital Status: Not on file  Intimate Partner Violence:   . Fear of Current or Ex-Partner: Not on file  . Emotionally Abused: Not on file  . Physically Abused: Not on file  . Sexually Abused: Not on file     Family History  Problem Relation Age of Onset  . Hypertension Father      Current Outpatient Medications:  .  Aspirin-Salicylamide-Caffeine (BC HEADACHE POWDER PO), Take 1 packet by mouth 3 (three) times daily., Disp: , Rfl:  .  axitinib (INLYTA) 5 MG tablet, Take 1 tablet (5 mg total) by mouth every 12 (twelve) hours., Disp: 60 tablet, Rfl: 4 .  chlorpheniramine-HYDROcodone (TUSSIONEX) 10-8 MG/5ML SUER, Take 5 mLs by mouth at bedtime as needed for cough., Disp: 140 mL, Rfl: 0 .  citalopram (CELEXA) 20 MG tablet, Take 1 tablet (20 mg total) by mouth daily., Disp: 30 tablet, Rfl: 3 .  morphine (MS CONTIN) 30 MG 12 hr tablet, Take 1 tablet (30 mg total) by mouth every 8 (eight) hours., Disp: 30 tablet, Rfl: 0 .  oxyCODONE-acetaminophen (PERCOCET) 10-325 MG tablet, Take 1 tablet by mouth every 8 (eight) hours as needed for pain., Disp: 45 tablet, Rfl: 0  Physical exam: There were no vitals filed for this visit. Limited  d/t telephone visit  CMP Latest Ref Rng & Units 09/30/2019  Glucose 70 - 99 mg/dL 97  BUN 6 - 20 mg/dL 14  Creatinine 0.44 - 1.00 mg/dL 0.78  Sodium 135 - 145 mmol/L 135  Potassium 3.5 - 5.1 mmol/L 4.1  Chloride 98 - 111 mmol/L 104  CO2 22 - 32 mmol/L 21(L)  Calcium 8.9 - 10.3 mg/dL 8.6(L)  Total Protein 6.5 - 8.1 g/dL -  Total Bilirubin 0.3 - 1.2 mg/dL -  Alkaline Phos 38 - 126 U/L -  AST 15 - 41 U/L -  ALT 14 - 54 U/L -   CBC Latest Ref Rng & Units 09/30/2019  WBC 4.0 - 10.5 K/uL 7.0  Hemoglobin 12.0 - 15.0 g/dL 10.0(L)  Hematocrit 36.0 - 46.0 % 33.0(L)  Platelets 150 - 400 K/uL 387    No images are attached to the encounter.  DG Chest 2 View  Result Date: 09/16/2019 CLINICAL DATA:  Seizure.  Chest pain and shortness of breath EXAM: CHEST - 2 VIEW COMPARISON:  None. FINDINGS: There are multiple nodular opacities throughout the lungs ranging in size from as small as 4 mm to as large as 2.5 by 2.5 cm. No edema or consolidation. Heart size and pulmonary  vascularity are normal. There is equivocal fullness in the left hilum, potentially representing adenopathy. Evidence of old fracture of the lateral left clavicle. No well-defined blastic or lytic bone lesions. No pneumothorax. IMPRESSION: Widespread pulmonary nodular opacities. Appearance suspicious for metastatic disease. Question a degree of hilar adenopathy. It may be prudent to correlate with contrast enhanced chest CT given these findings. No edema or consolidation.  Heart size normal. Electronically Signed   By: Lowella Grip III M.D.   On: 09/16/2019 14:25   CT Angio Chest PE W and/or Wo Contrast  Result Date: 09/16/2019 CLINICAL DATA:  Pleuritic chest pain, abnormal chest x-ray EXAM: CT ANGIOGRAPHY CHEST WITH CONTRAST TECHNIQUE: Multidetector CT imaging of the chest was performed using the standard protocol during bolus administration of intravenous contrast. Multiplanar CT image reconstructions and MIPs were obtained to evaluate the vascular anatomy. CONTRAST:  41m OMNIPAQUE IOHEXOL 350 MG/ML SOLN COMPARISON:  Same day chest radiograph FINDINGS: Cardiovascular: Satisfactory opacification of the pulmonary arteries to the segmental level. No evidence of pulmonary embolism. Normal heart size. Left coronary artery calcifications. No pericardial effusion. Mediastinum/Nodes: Numerous bulky mediastinal and bilateral hilar lymph nodes. Largest subcarinal node measures 3.5 x 2.6 cm (series 5, image 142). There is an enlarged superior mediastinal/paraesophageal lymph node which exerts mass effect on the right aspect of the esophagus measuring approximately 2.3 x 1.7 cm (series 5, image 29). There is a large pleural and anterior mediastinal mass measuring at least 5.1 x 4.9 cm (series 5, image 120). Thyroid gland, trachea, and esophagus demonstrate no significant findings. Lungs/Pleura: Mild emphysema. There are numerous small pulmonary nodules throughout the lungs bilaterally and multiple left-sided pleural  nodules and masses. There is a large pleural and chest wall mass of the anterior left upper lobe, with overt destruction of the anterior left second rib, measuring at least 6.9 x 4.6 cm (series 5, image 87). No pleural effusion or pneumothorax. Upper Abdomen: No acute abnormality. Musculoskeletal: No chest wall abnormality. Destructive soft tissue masses involving the anterior left ribs as detailed above. Additional lytic lesions of the left aspect of the sternal body (series 6, image 34, 45). Review of the MIP images confirms the above findings. IMPRESSION: 1. Negative examination for pulmonary embolism. 2. Large pleural and  chest wall mass of the anterior left upper lobe, with overt destruction of the anterior left second rib. 3. There is a large adjacent pleural and anterior mediastinal mass measuring at least 5.1 x 4.9 cm (series 5, image 120). 4. Numerous bulky mediastinal and bilateral hilar lymph nodes, consistent with metastatic disease. 5. Numerous pulmonary nodules. 6. Additional lytic lesions of the left aspect of the sternal body, consistent with additional osseous involvement, likely due to direct extension from adjacent masses. 7. There is an enlarged superior mediastinal/paraesophageal lymph node which exerts mass effect on the right aspect of the esophagus measuring approximately 2.3 x 1.7 cm (series 5, image 29). This may be specifically symptomatic due to location. Correlate with dysphagia. 8. Emphysema (ICD10-J43.9). Electronically Signed   By: Eddie Candle M.D.   On: 09/16/2019 17:27   CT Biopsy  Result Date: 09/24/2019 INDICATION: Large anterior left chest wall mediastinal mass EXAM: CT biopsy left anterior chest wall mass MEDICATIONS: 1% lidocaine local ANESTHESIA/SEDATION: Moderate (conscious) sedation was employed during this procedure. A total of Versed 2.0 mg and Fentanyl 100 mcg was administered intravenously. Moderate Sedation Time: 11 minutes. The patient's level of consciousness and  vital signs were monitored continuously by radiology nursing throughout the procedure under my direct supervision. FLUOROSCOPY TIME:  Fluoroscopy Time: None. COMPLICATIONS: None immediate. PROCEDURE: Informed written consent was obtained from the patient after a thorough discussion of the procedural risks, benefits and alternatives. All questions were addressed. Maximal Sterile Barrier Technique was utilized including caps, mask, sterile gowns, sterile gloves, sterile drape, hand hygiene and skin antiseptic. A timeout was performed prior to the initiation of the procedure. Previous imaging reviewed. Patient positioned supine. Noncontrast localization CT performed. The anterior left chest wall invasive mass was localized and marked. Under sterile conditions and local anesthesia, a 17 gauge coaxial guide was advanced from an anterior parasternal approach to the lesion. Needle position confirmed with CT. 18 gauge core biopsies obtained. Samples were intact and non fragmented. These were placed in formalin. Needle removed. Postprocedure imaging demonstrates no hemorrhage or hematoma. Patient tolerated the biopsy IMPRESSION: Successful CT-guided left anterior chest wall invasive mass 18 gauge core biopsies Electronically Signed   By: Jerilynn Mages.  Shick M.D.   On: 09/24/2019 11:41   Assessment and plan- Patient is a 49 y.o. female who presents to [facility] for initial meeting in preparation for starting chemotherapy for the treatment of metastatic renal cell carcinoma.   1. HPI: Patient with past medical history of renal cell carcinoma diagnosed in 2016.  She is status post resection at [facility] with negative margins.  She also has a history of cyst history in 2002 of cervical cancer status post LEEP/colonization.  She recently presented to the emergency room with chest pain.  CT scan showed a large pleural and left chest wall mass in the anterior mediastinum with the root destruction of the anterior left second rib.  There  is also an adjacent pleural and anterior mediastinal mass measuring at least 5.1 cm in greatest dimension.  There is numerous bulky mediastinal bilateral hilar lymph nodes consistent with metastatic disease as well as numerous pulmonary nodules.  There is also a lytic lesion of the left aspect of the sternal body consistent with osseous metastasis disease.  She has undergone biopsy of chest mass revealing renal cell carcinoma.  Plan is for palliative radiation therapy to left anterior chest wall mass.  Plan is for delivery of 3000 cGy in 10 fractions.   Plan is for [facility]  plus Inlyta.  2. Chemo Care Clinic/High Risk for ER/Hospitalization during chemotherapy- We discussed the role of the chemo care clinic and identified patient specific risk factors. I discussed that patient was identified as high risk primarily based on stage of disease and comorbidities.  Patient has past medical history positive for: Past Medical History:  Diagnosis Date  . Cervical cancer ([place])   . Renal cell carcinoma ([place]) 2016   Pathologic stage from 07/08/2015: Stage Unknown (T3a, NX, cM0) - Signed by Darin Engels, NP on 11/25/2015    Patient has past surgical history positive for: Past Surgical History:  Procedure Laterality Date  . CERVICAL BIOPSY  W/ LOOP ELECTRODE EXCISION    . PARTIAL NEPHRECTOMY Left 07/08/2015   robotic laparoscopic nephrectomy at [facility]    Based on our high risk symptom management report; this patient has a high risk of ED utilization.  The percentage below indicates how "at risk "  this patient based on the factors in this table within one year.   52% High Risk Risk of Admission or ED Visit  This score indicates an adult patient's 1-year risk, as a percentage, of a hospital admission or ED visit.    0%  52%   40%   20%   5 months ago 3 months ago 8 weeks ago Today              1% 04/08/19 2000   52% 09/16/19 1648   33% 09/17/19 1253   52% 09/20/19 1833   33% 09/30/19 1205   33% 10/01/19  1434   52% 10/02/19 0905   52% 10/06/19 1215    Factor Value  Current PCP No Pcp Per Patient  Hospital Admissions 0  ED Visits 2   Has Medicaid Yes  Has Medicare No  In relationship No  Has Anemia No  Has asthma No  Has atrial fibrillation No  Has CVD No  Has chronic kidney disease No  Has Chronic Obstructive Pulmonary Disease No  Has Congestive Heart Failure No  Has Connective Tissue Disorder No  Has Depression No  Has Diabetes No  Has liver disease No  Has Peripheral Vascular Disease No    3. We discussed that social determinants of health may have significant impacts on health and outcomes for cancer patients.  Today we discussed specific social determinants of performance status, alcohol use, depression, financial needs, food insecurity, housing, interpersonal violence, social connections, stress, tobacco use, and transportation.    After lengthy discussion the following were identified as areas of need: Financial concerns and help with medicaid.   I spoke with Edgardo Roys, patient's friend and primary caregiver at length today.  Patient currently lives with [place].  Patient does have a sister named Vaughan Basta whom is also involved in her care.  Velna Hatchet states that Seini has 5-6 episodes daily of intermittent chest and rib pain that last for 10 to 20 minutes at a time.  Unfortunately, this is been keeping her up at night.  She also has a significant cough with sputum production and uses a rescue inhaler.  She is requesting a refill of her Tussionex because this does help with cough at nighttime and helps her sleep.  Patient ran out of short acting pain medication this past Saturday and is currently only been taking her MS Contin.  She is requesting a refill of her short acting medication today.    We discussed options including home based and outpatient services, DME, and CARE program. We  discusssed that patients who participate in regular physical activity report fewer negative impacts of  cancer and treatments and report less fatigue.  We discussed self-referral to [facility] for counseling services, psychiatry for medication management, or palliative care/symptom management as well as primary care providers.  We discussed that living with cancer can create tremendous financial burden.  We discussed options for assistance. I asked that if assistance is needed in affording medications or paying bills to please let us know so that we can provide assistance.  We discussed options for food including social services.  We will also notify [facility] to see if cancer center can provide support.  Patient informed of food pantry at cancer center and was provided with care package today.  Please notify nursing if un-met needs. We discussed referral to social work. Will also discuss with Elease Etienne to see if [place] can provide support of utility bills, etc.  We discussed options for support groups at the cancer center. If interested, please notify nurse navigator to enroll.  We discussed options for managing stress including healthy eating, exercise as well as participating in no charge counseling services at the cancer center and support groups.  If these are of interest, patient can notify either myself or primary nursing team. We discussed options for management including medications and referral to quit Smart program We discussed options for transportation including acta, paratransit, bus routes, link transit, taxi/uber/lyft, and cancer center van.  I have notified primary oncology team who will help assist with arranging Lucianne Lei transportation for appointments when needed. We also discussed options for transportation on short notice/acute visits.   5.  Patient has already established care with Altha Harm, NP in palliative medicine.  We also discussed the role of the Symptom Management Clinic at [facility] for acute issues and methods of contacting clinic/provider. She denies needing  specific assistance at this time and She will be followed by Renita Papa, RN.   Plan: Discuss symptom management clinic. Discussed palliative medicine. Discussed financial concerns. Patient needs a refill on her Percocet 10/325 mg tablets.  She ran out on Saturday.  Refill sent. Patient requesting a refill of her Tussionex for cough.  Refill sent Provided Katherina Right information for assistance.   Disposition: RTC for port placement this afternoon. RTC tomorrow for radiation simulation. RTC on 10/07/2019 for imaging (CT chest for abdomen for pelvis and MRI of brain) RTC on 10/09/2019 for lab work and assessment with Dr. Oneita Jolly day prior to first cycle of Keytruda.  She will also begin Inlyta at that time.  Visit Diagnosis 1. Cancer of left kidney [facility])     Patient expressed understanding and was in agreement with this plan. She also understands that She can call clinic at any time with any questions, concerns, or complaints.   A total of (25) minutes of face-to-face time was spent with this patient with greater than 50% of that time in counseling and care-coordination.  [place] at [place]  CC: Dr. Rogue Bussing

## 2019-10-06 ENCOUNTER — Other Ambulatory Visit: Payer: Self-pay

## 2019-10-06 ENCOUNTER — Ambulatory Visit
Admission: RE | Admit: 2019-10-06 | Discharge: 2019-10-06 | Disposition: A | Discharge status: Home / Self Care | Payer: Self-pay | Source: Ambulatory Visit | Attending: Radiation Oncology | Admitting: Radiation Oncology

## 2019-10-06 MED ORDER — HYDROCOD POLST-CPM POLST ER 10-8 MG/5ML PO SUER: 5.0000 mL | Freq: Every evening | ORAL | 0 refills | Status: DC | PRN

## 2019-10-06 NOTE — Telephone Encounter (Signed)
Oral Oncology Patient Advocate Encounter  Received notification from [place] Patient Assistance program that patient has been successfully enrolled into their program to receive Inlyta from the manufacturer at $0 out of pocket until 10/05/2020.    I called and spoke with patient.  He knows we will have to re-apply.   Patient knows to call the office with questions or concerns.   Oral Oncology Clinic will continue to follow.  [place] [facility] [place] Phone [phone] Fax [phone] 10/06/2019 4:09 PM

## 2019-10-07 ENCOUNTER — Encounter: Payer: Self-pay | Admitting: Pharmacy Technician

## 2019-10-07 ENCOUNTER — Ambulatory Visit: Payer: Self-pay

## 2019-10-07 ENCOUNTER — Encounter: Payer: Self-pay | Admitting: Oncology

## 2019-10-07 ENCOUNTER — Other Ambulatory Visit: Payer: Medicaid Other

## 2019-10-07 ENCOUNTER — Ambulatory Visit (HOSPITAL_COMMUNITY): Payer: Medicaid Other

## 2019-10-07 ENCOUNTER — Ambulatory Visit (HOSPITAL_COMMUNITY)
Admission: RE | Admit: 2019-10-07 | Discharge: 2019-10-07 | Disposition: A | Discharge status: Home / Self Care | Payer: Self-pay | Source: Ambulatory Visit | Attending: Internal Medicine | Admitting: Internal Medicine

## 2019-10-07 NOTE — Progress Notes (Signed)
Pt was a no show for imaging appointments.

## 2019-10-07 NOTE — Progress Notes (Signed)
Patient has been approved for drug assistance by [employer] for [employer]. The enrollment period is from 10/07/19-10/06/20 based on self pay. First DOS covered is 10/09/19.

## 2019-10-08 ENCOUNTER — Other Ambulatory Visit: Payer: Self-pay

## 2019-10-08 ENCOUNTER — Other Ambulatory Visit: Payer: Self-pay | Admitting: *Deleted

## 2019-10-08 ENCOUNTER — Other Ambulatory Visit: Payer: Medicaid Other

## 2019-10-08 ENCOUNTER — Ambulatory Visit: Payer: Medicaid Other | Admitting: Internal Medicine

## 2019-10-08 ENCOUNTER — Ambulatory Visit: Payer: Medicaid Other

## 2019-10-08 ENCOUNTER — Ambulatory Visit
Admission: RE | Admit: 2019-10-08 | Discharge: 2019-10-08 | Disposition: A | Discharge status: Home / Self Care | Payer: Self-pay | Source: Ambulatory Visit | Attending: Radiation Oncology | Admitting: Radiation Oncology

## 2019-10-08 DIAGNOSIS — Z95828 Presence of other vascular implants and grafts: Secondary | ICD-10-CM

## 2019-10-08 MED ORDER — LIDOCAINE-PRILOCAINE 2.5-2.5 % EX CREA: 1.0000 "application " | TOPICAL_CREAM | CUTANEOUS | 3 refills | Status: AC | PRN

## 2019-10-08 NOTE — Progress Notes (Signed)
Patient pre screened for office appointment, no questions or concerns today. Patient reminded of upcoming appointment time and date. 

## 2019-10-09 ENCOUNTER — Ambulatory Visit
Admission: RE | Admit: 2019-10-09 | Discharge: 2019-10-09 | Disposition: A | Discharge status: Home / Self Care | Payer: Self-pay | Source: Ambulatory Visit | Attending: Radiation Oncology | Admitting: Radiation Oncology

## 2019-10-09 ENCOUNTER — Inpatient Hospital Stay (HOSPITAL_BASED_OUTPATIENT_CLINIC_OR_DEPARTMENT_OTHER): Payer: Self-pay | Admitting: Internal Medicine

## 2019-10-09 ENCOUNTER — Inpatient Hospital Stay (HOSPITAL_BASED_OUTPATIENT_CLINIC_OR_DEPARTMENT_OTHER): Payer: Self-pay | Admitting: Hospice and Palliative Medicine

## 2019-10-09 ENCOUNTER — Inpatient Hospital Stay: Payer: Self-pay

## 2019-10-09 ENCOUNTER — Other Ambulatory Visit: Payer: Self-pay

## 2019-10-09 VITALS — BP 100/63 | HR 88

## 2019-10-09 DIAGNOSIS — Z7189 Other specified counseling: Secondary | ICD-10-CM

## 2019-10-09 DIAGNOSIS — C642 Malignant neoplasm of left kidney, except renal pelvis: Secondary | ICD-10-CM

## 2019-10-09 DIAGNOSIS — G893 Neoplasm related pain (acute) (chronic): Secondary | ICD-10-CM

## 2019-10-09 DIAGNOSIS — Z95828 Presence of other vascular implants and grafts: Secondary | ICD-10-CM

## 2019-10-09 DIAGNOSIS — Z515 Encounter for palliative care: Secondary | ICD-10-CM

## 2019-10-09 LAB — CBC WITH DIFFERENTIAL/PLATELET
Abs Immature Granulocytes: 0 10*3/uL (ref 0.00–0.07)
Basophils Absolute: 0 10*3/uL (ref 0.0–0.1)
Basophils Relative: 0 %
Eosinophils Absolute: 0.1 10*3/uL (ref 0.0–0.5)
Eosinophils Relative: 2 %
HCT: 29.7 % — ABNORMAL LOW (ref 36.0–46.0)
Hemoglobin: 8.8 g/dL — ABNORMAL LOW (ref 12.0–15.0)
Lymphocytes Relative: 36 %
Lymphs Abs: 2.6 10*3/uL (ref 0.7–4.0)
MCH: 27.1 pg (ref 26.0–34.0)
MCHC: 29.6 g/dL — ABNORMAL LOW (ref 30.0–36.0)
MCV: 91.4 fL (ref 80.0–100.0)
Monocytes Absolute: 0.6 10*3/uL (ref 0.1–1.0)
Monocytes Relative: 8 %
Neutro Abs: 3.8 10*3/uL (ref 1.7–7.7)
Neutrophils Relative %: 54 %
Platelets: 372 10*3/uL (ref 150–400)
RBC: 3.25 MIL/uL — ABNORMAL LOW (ref 3.87–5.11)
RDW: 15.1 % (ref 11.5–15.5)
Smear Review: ADEQUATE
WBC Morphology: ABNORMAL
WBC: 7.1 10*3/uL (ref 4.0–10.5)
nRBC: 0 % (ref 0.0–0.2)

## 2019-10-09 LAB — COMPREHENSIVE METABOLIC PANEL
ALT: 8 U/L (ref 0–44)
AST: 18 U/L (ref 15–41)
Albumin: 2.3 g/dL — ABNORMAL LOW (ref 3.5–5.0)
Alkaline Phosphatase: 88 U/L (ref 38–126)
Anion gap: 11 (ref 5–15)
BUN: 11 mg/dL (ref 6–20)
CO2: 22 mmol/L (ref 22–32)
Calcium: 8.9 mg/dL (ref 8.9–10.3)
Chloride: 100 mmol/L (ref 98–111)
Creatinine, Ser: 0.7 mg/dL (ref 0.44–1.00)
GFR calc Af Amer: >60 mL/min (ref >60)
GFR calc non Af Amer: >60 mL/min (ref >60)
Glucose, Bld: 108 mg/dL — ABNORMAL HIGH (ref 70–99)
Potassium: 4 mmol/L (ref 3.5–5.1)
Sodium: 133 mmol/L — ABNORMAL LOW (ref 135–145)
Total Bilirubin: 0.5 mg/dL (ref 0.3–1.2)
Total Protein: 6.6 g/dL (ref 6.5–8.1)

## 2019-10-09 LAB — TSH: TSH: 1.418 u[IU]/mL (ref 0.350–4.500)

## 2019-10-09 MED ORDER — SODIUM CHLORIDE 0.9 % IV SOLN
Freq: Once | INTRAVENOUS | Status: AC
  Filled 2019-10-09: qty 250

## 2019-10-09 MED ORDER — SODIUM CHLORIDE 0.9% FLUSH
10.0000 mL | Freq: Once | INTRAVENOUS | Status: AC
  Administered 2019-10-09: 10 mL via INTRAVENOUS
  Filled 2019-10-09: qty 10

## 2019-10-09 MED ORDER — SODIUM CHLORIDE 0.9 % IV SOLN
200.0000 mg | Freq: Once | INTRAVENOUS | Status: AC
  Administered 2019-10-09: 200 mg via INTRAVENOUS
  Filled 2019-10-09: qty 8

## 2019-10-09 MED ORDER — PROCHLORPERAZINE MALEATE 10 MG PO TABS: 10.0000 mg | ORAL_TABLET | Freq: Four times a day (QID) | ORAL | 1 refills | Status: AC | PRN

## 2019-10-09 MED ORDER — HEPARIN SOD (PORK) LOCK FLUSH 100 UNIT/ML IV SOLN
INTRAVENOUS | Status: AC
  Filled 2019-10-09: qty 5

## 2019-10-09 MED ORDER — ONDANSETRON HCL 8 MG PO TABS: ORAL_TABLET | ORAL | 1 refills | Status: AC

## 2019-10-09 MED ORDER — OXYCODONE-ACETAMINOPHEN 10-325 MG PO TABS: 1.0000 | ORAL_TABLET | Freq: Four times a day (QID) | ORAL | 0 refills | Status: DC | PRN

## 2019-10-09 MED ORDER — HEPARIN SOD (PORK) LOCK FLUSH 100 UNIT/ML IV SOLN
500.0000 [IU] | Freq: Once | INTRAVENOUS | Status: AC | PRN
  Administered 2019-10-09: 500 [IU]
  Filled 2019-10-09: qty 5

## 2019-10-09 NOTE — Progress Notes (Signed)
[place]  [phone] [phone]   Name: DEMII Black Date: 10/09/2019 MRN: [id number]  DOB: [birth year]  Patient Care Team: Patient, No Pcp Per as PCP - General (General Practice)    REASON FOR CONSULTATION: Angelica Black is a 49 y.o. 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  Patient has had chest wall pain, poor oral intake, and weakness.  She is referred to palliative care to help address goals and manage ongoing symptoms.   SOCIAL HISTORY:     reports that she has been smoking cigarettes. She has a 35.00 pack-year smoking history. She has never used smokeless tobacco. She reports current alcohol use.   Patient has a friend Angelica Black who is involved in her care  ADVANCE DIRECTIVES:    CODE STATUS:   PAST MEDICAL HISTORY: Past Medical History:  Diagnosis Date  . Cervical cancer ([place])   . Renal cell carcinoma ([place]) 2016   Pathologic stage from 07/08/2015: Stage Unknown (T3a, NX, cM0) - Signed by Darin Engels, NP on 11/25/2015    PAST SURGICAL HISTORY:  Past Surgical History:  Procedure Laterality Date  . CERVICAL BIOPSY  W/ LOOP ELECTRODE EXCISION    . IR IMAGING GUIDED PORT INSERTION  10/05/2019  . PARTIAL NEPHRECTOMY Left 07/08/2015   robotic laparoscopic nephrectomy at [facility]    HEMATOLOGY/ONCOLOGY HISTORY:  Oncology History Overview Note  # DEC 2020- left chest wall mass; bil mediatisnal LN; bil Lung nodules.   #  2016[facility] ]Renal cell carcinoma, clear cell type, Furhman grade 2 measuring 6.4. It extended into major veins. Negative margins. 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 [CT chetst wall Bx]; Poor risk [anemia;PS;hypercalcemia]; Start RT [10/09/2019]  # JAN 22nd, 2021-Key+Axi  # 2002- cervical cancer LEEP/conization; no  chemo-RT  # dec 2020- Hypercalcemia- Ca- 12; Zometa  # NGS/MOLECULAR TESTS: pending.   # PALLIATIVE CARE EVALUATION:P  # PAIN MANAGEMENT: P  DIAGNOSIS: RCC  STAGE:   IV    ;  GOALS: palliative  CURRENT/MOST RECENT THERAPY : key+Axi [C]    Cancer of left kidney ([place])  09/17/2019 Initial Diagnosis   Cancer of left kidney ([place])   10/09/2019 -  Chemotherapy   The patient had pembrolizumab (KEYTRUDA) 200 mg in sodium chloride 0.9 % 50 mL chemo infusion, 200 mg, Intravenous, Once, 1 of 6 cycles  for chemotherapy treatment.      ALLERGIES:  has No Known Allergies.  MEDICATIONS:  Current Outpatient Medications  Medication Sig Dispense Refill  . Aspirin-Salicylamide-Caffeine (BC HEADACHE POWDER PO) Take 1 packet by mouth 3 (three) times daily.    Marland Kitchen axitinib (INLYTA) 5 MG tablet Take 1 tablet (5 mg total) by mouth every 12 (twelve) hours. 60 tablet 4  . chlorpheniramine-HYDROcodone (TUSSIONEX) 10-8 MG/5ML SUER Take 5 mLs by mouth at bedtime as needed for cough. 140 mL 0  . citalopram (CELEXA) 20 MG tablet Take 1 tablet (20 mg total) by mouth daily. 30 tablet 3  . lidocaine-prilocaine (EMLA) cream Apply 1 application topically as needed. 30 g 3  . morphine (MS CONTIN) 30 MG 12 hr tablet Take 1 tablet (30 mg total) by mouth every 8 (eight) hours. 30 tablet 0  . ondansetron (ZOFRAN) 8 MG tablet One pill every 8 hours as needed for nausea/vomitting. 40 tablet 1  . oxyCODONE-acetaminophen (PERCOCET)  10-325 MG tablet Take 1 tablet by mouth every 8 (eight) hours as needed for pain. 45 tablet 0  . prochlorperazine (COMPAZINE) 10 MG tablet Take 1 tablet (10 mg total) by mouth every 6 (six) hours as needed for nausea or vomiting. 40 tablet 1   No current facility-administered medications for this visit.   Facility-Administered Medications Ordered in Other Visits  Medication Dose Route Frequency Provider Last Rate Last Admin  . heparin lock flush 100 unit/mL  500 Units Intracatheter Once PRN  Cammie Sickle, MD      . pembrolizumab [facility]) 200 mg in sodium chloride 0.9 % 50 mL chemo infusion  200 mg Intravenous Once Cammie Sickle, MD        VITAL SIGNS: LMP  (LMP Unknown)  There were no vitals filed for this visit.  Estimated body mass index is 21.86 kg/m as calculated from the following:   Height as of 10/05/19: 5\' 8"  (1.727 m).   Weight as of an earlier encounter on 10/09/19: 143 lb 12.8 oz (65.2 kg).  LABS: CBC:    Component Value Date/Time   WBC 7.1 10/09/2019 1250   HGB 8.8 (L) 10/09/2019 1250   HCT 29.7 (L) 10/09/2019 1250   PLT 372 10/09/2019 1250   MCV 91.4 10/09/2019 1250   NEUTROABS 3.8 10/09/2019 1250   LYMPHSABS 2.6 10/09/2019 1250   MONOABS 0.6 10/09/2019 1250   EOSABS 0.1 10/09/2019 1250   BASOSABS 0.0 10/09/2019 1250   Comprehensive Metabolic Panel:    Component Value Date/Time   NA 133 (L) 10/09/2019 1250   K 4.0 10/09/2019 1250   CL 100 10/09/2019 1250   CO2 22 10/09/2019 1250   BUN 11 10/09/2019 1250   CREATININE 0.70 10/09/2019 1250   GLUCOSE 108 (H) 10/09/2019 1250   CALCIUM 8.9 10/09/2019 1250   AST 18 10/09/2019 1250   ALT 8 10/09/2019 1250   ALKPHOS 88 10/09/2019 1250   BILITOT 0.5 10/09/2019 1250   PROT 6.6 10/09/2019 1250   ALBUMIN 2.3 (L) 10/09/2019 1250    RADIOGRAPHIC STUDIES: DG Chest 2 View  Result Date: 09/16/2019 CLINICAL DATA:  Seizure.  Chest pain and shortness of breath EXAM: CHEST - 2 VIEW COMPARISON:  None. FINDINGS: There are multiple nodular opacities throughout the lungs ranging in size from as small as 4 mm to as large as 2.5 by 2.5 cm. No edema or consolidation. Heart size and pulmonary vascularity are normal. There is equivocal fullness in the left hilum, potentially representing adenopathy. Evidence of old fracture of the lateral left clavicle. No well-defined blastic or lytic bone lesions. No pneumothorax. IMPRESSION: Widespread pulmonary nodular opacities. Appearance suspicious for metastatic  disease. Question a degree of hilar adenopathy. It may be prudent to correlate with contrast enhanced chest CT given these findings. No edema or consolidation.  Heart size normal. Electronically Signed   By: Lowella Grip III M.D.   On: 09/16/2019 14:25   CT Angio Chest PE W and/or Wo Contrast  Result Date: 09/16/2019 CLINICAL DATA:  Pleuritic chest pain, abnormal chest x-ray EXAM: CT ANGIOGRAPHY CHEST WITH CONTRAST TECHNIQUE: Multidetector CT imaging of the chest was performed using the standard protocol during bolus administration of intravenous contrast. Multiplanar CT image reconstructions and MIPs were obtained to evaluate the vascular anatomy. CONTRAST:  28mL OMNIPAQUE IOHEXOL 350 MG/ML SOLN COMPARISON:  Same day chest radiograph FINDINGS: Cardiovascular: Satisfactory opacification of the pulmonary arteries to the segmental level. No evidence of pulmonary embolism. Normal heart size. Left coronary artery  calcifications. No pericardial effusion. Mediastinum/Nodes: Numerous bulky mediastinal and bilateral hilar lymph nodes. Largest subcarinal node measures 3.5 x 2.6 cm (series 5, image 142). There is an enlarged superior mediastinal/paraesophageal lymph node which exerts mass effect on the right aspect of the esophagus measuring approximately 2.3 x 1.7 cm (series 5, image 29). There is a large pleural and anterior mediastinal mass measuring at least 5.1 x 4.9 cm (series 5, image 120). Thyroid gland, trachea, and esophagus demonstrate no significant findings. Lungs/Pleura: Mild emphysema. There are numerous small pulmonary nodules throughout the lungs bilaterally and multiple left-sided pleural nodules and masses. There is a large pleural and chest wall mass of the anterior left upper lobe, with overt destruction of the anterior left second rib, measuring at least 6.9 x 4.6 cm (series 5, image 87). No pleural effusion or pneumothorax. Upper Abdomen: No acute abnormality. Musculoskeletal: No chest wall  abnormality. Destructive soft tissue masses involving the anterior left ribs as detailed above. Additional lytic lesions of the left aspect of the sternal body (series 6, image 34, 45). Review of the MIP images confirms the above findings. IMPRESSION: 1. Negative examination for pulmonary embolism. 2. Large pleural and chest wall mass of the anterior left upper lobe, with overt destruction of the anterior left second rib. 3. There is a large adjacent pleural and anterior mediastinal mass measuring at least 5.1 x 4.9 cm (series 5, image 120). 4. Numerous bulky mediastinal and bilateral hilar lymph nodes, consistent with metastatic disease. 5. Numerous pulmonary nodules. 6. Additional lytic lesions of the left aspect of the sternal body, consistent with additional osseous involvement, likely due to direct extension from adjacent masses. 7. There is an enlarged superior mediastinal/paraesophageal lymph node which exerts mass effect on the right aspect of the esophagus measuring approximately 2.3 x 1.7 cm (series 5, image 29). This may be specifically symptomatic due to location. Correlate with dysphagia. 8. Emphysema (ICD10-J43.9). Electronically Signed   By: Eddie Candle M.D.   On: 09/16/2019 17:27   CT Biopsy  Result Date: 09/24/2019 INDICATION: Large anterior left chest wall mediastinal mass EXAM: CT biopsy left anterior chest wall mass MEDICATIONS: 1% lidocaine local ANESTHESIA/SEDATION: Moderate (conscious) sedation was employed during this procedure. A total of Versed 2.0 mg and Fentanyl 100 mcg was administered intravenously. Moderate Sedation Time: 11 minutes. The patient's level of consciousness and vital signs were monitored continuously by radiology nursing throughout the procedure under my direct supervision. FLUOROSCOPY TIME:  Fluoroscopy Time: None. COMPLICATIONS: None immediate. PROCEDURE: Informed written consent was obtained from the patient after a thorough discussion of the procedural risks,  benefits and alternatives. All questions were addressed. Maximal Sterile Barrier Technique was utilized including caps, mask, sterile gowns, sterile gloves, sterile drape, hand hygiene and skin antiseptic. A timeout was performed prior to the initiation of the procedure. Previous imaging reviewed. Patient positioned supine. Noncontrast localization CT performed. The anterior left chest wall invasive mass was localized and marked. Under sterile conditions and local anesthesia, a 17 gauge coaxial guide was advanced from an anterior parasternal approach to the lesion. Needle position confirmed with CT. 18 gauge core biopsies obtained. Samples were intact and non fragmented. These were placed in formalin. Needle removed. Postprocedure imaging demonstrates no hemorrhage or hematoma. Patient tolerated the biopsy IMPRESSION: Successful CT-guided left anterior chest wall invasive mass 18 gauge core biopsies Electronically Signed   By: Jerilynn Mages.  Shick M.D.   On: 09/24/2019 11:41   IR IMAGING GUIDED PORT INSERTION  Result Date: 10/05/2019 INDICATION: 49 year old female  referred for port placement EXAM: IMPLANTED PORT A CATH PLACEMENT WITH ULTRASOUND AND FLUOROSCOPIC GUIDANCE MEDICATIONS: 2 G ANCEF; The antibiotic was administered within an appropriate time interval prior to skin puncture. ANESTHESIA/SEDATION: Moderate (conscious) sedation was employed during this procedure. A total of Versed 3.0 mg and Fentanyl 100 mcg was administered intravenously. Moderate Sedation Time: 17 minutes. The patient's level of consciousness and vital signs were monitored continuously by radiology nursing throughout the procedure under my direct supervision. FLUOROSCOPY TIME:  0 minutes, 18 seconds (5.3 mGy) COMPLICATIONS: NONE PROCEDURE: The procedure, risks, benefits, and alternatives were explained to the patient. Questions regarding the procedure were encouraged and answered. The patient understands and consents to the procedure. Ultrasound  survey was performed with images stored and sent to PACs. The right neck and chest was prepped with chlorhexidine, and draped in the usual sterile fashion using maximum barrier technique (cap and mask, sterile gown, sterile gloves, large sterile sheet, hand hygiene and cutaneous antiseptic). Antibiotic prophylaxis was provided with 2.0g Ancef administered IV one hour prior to skin incision. Local anesthesia was attained by infiltration with 1% lidocaine without epinephrine. Ultrasound demonstrated patency of the right internal jugular vein, and this was documented with an image. Under real-time ultrasound guidance, this vein was accessed with a 21 gauge micropuncture needle and image documentation was performed. A small dermatotomy was made at the access site with an 11 scalpel. A 0.018" wire was advanced into the SVC and used to estimate the length of the internal catheter. The access needle exchanged for a 16F micropuncture vascular sheath. The 0.018" wire was then removed and a 0.035" wire advanced into the IVC. An appropriate location for the subcutaneous reservoir was selected below the clavicle and an incision was made through the skin and underlying soft tissues. The subcutaneous tissues were then dissected using a combination of blunt and sharp surgical technique and a pocket was formed. A single lumen power injectable portacatheter was then tunneled through the subcutaneous tissues from the pocket to the dermatotomy and the port reservoir placed within the subcutaneous pocket. The venous access site was then serially dilated and a peel away vascular sheath placed over the wire. The wire was removed and the port catheter advanced into position under fluoroscopic guidance. The catheter tip is positioned in the cavoatrial junction. This was documented with a spot image. The portacatheter was then tested and found to flush and aspirate well. The port was flushed with saline followed by 100 units/mL heparinized  saline. The pocket was then closed in two layers using first subdermal inverted interrupted absorbable sutures followed by a running subcuticular suture. The epidermis was then sealed with Dermabond. The dermatotomy at the venous access site was also seal with Dermabond. Patient tolerated the procedure well and remained hemodynamically stable throughout. No complications encountered and no significant blood loss encountered IMPRESSION: Status post right IJ port catheter placement. Signed, Dulcy Fanny. Dellia Nims, RPVI [facility] Electronically Signed   By: Corrie Mckusick D.O.   On: 10/05/2019 16:43    PERFORMANCE STATUS (ECOG) : 2 - Symptomatic, <50% confined to bed  Review of Systems Unless otherwise noted, a complete review of systems is negative.  Physical Exam General: NAD, frail appearing, thin Pulmonary: Unlabored Extremities: no edema, no joint deformities Skin: no rashes Neurological: Weakness but otherwise nonfocal  IMPRESSION: I had a brief meeting today with patient and her friend.  I introduced palliative care services and attempted to establish therapeutic rapport.  Visit  was cut short as patient had to transition to the infusion area to start Keytruda infusion.  Symptomatically, patient endorses persistent pain in the chest wall area.  Pain is interfering her sleep.  She is taking Percocet every 6-8 hours.  She is also taking MS Contin 30 mg 3 times daily.  Percocet is currently prescribed every 8 hours.  It would be reasonable to increase to every 6 hours as needed.  PLAN: -Continue current scope of treatment -Continue MS Contin -Increase Percocet every 6 hours as needed -Prophylactic bowel regimen -We will need to address goals/ACP/MOST form -RTC in 3 weeks   Patient expressed understanding and was in agreement with this plan. She also understands that She can call the clinic at any time with any questions, concerns,  or complaints.     Time Total: 15 minutes  Visit consisted of counseling and education dealing with the complex and emotionally intense issues of symptom management and palliative care in the setting of serious and potentially life-threatening illness.Greater than 50%  of this time was spent counseling and coordinating care related to the above assessment and plan.  Signed by: Altha Harm, PhD, NP-C

## 2019-10-09 NOTE — Assessment & Plan Note (Addendum)
#  Metastatic renal cell carcinoma-stage IV [lung nodules chest wall mass]; poor risk.  Awaiting- MRI brain; CT abdomen pelvis-we will reschedule when patient's convenience.  Currently on axitinib plus Keytruda.   #Proceed with Keytruda cycle #1. Labs today reviewed;  acceptable for treatment today.   #Left chest wall pain-likely malignancy.  Stable on MS Contin to 30 mg 3 times a day; Percocet as needed.  Current on radiation.    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  Hold Zometa.  I spoke at length with the patient's friend, Angelica Black-  regarding the patient's clinical status/plan of care.  # DISPOSITION: # Angelica Black- nutrition referral # Keytruda today #  Skeletal survey today # Re-schedule MRI brain/CT A/P [facility]  # Follow up IN 3 weeks; MD: labs- cbc/cmp/LDH;Keytruda- Dr.B

## 2019-10-09 NOTE — Progress Notes (Signed)
Pt tolerated first time Keytruda infusion well with no signs of complications. VSS. RN educated pt on the importance of calling the clinic if any signs of complications occur at home or if it is an emergency to call 911. Pt verbalized understanding and all questions answered at this time. Pt stable for discharge.   Angelica Black [employer]

## 2019-10-09 NOTE — Progress Notes (Signed)
[employer] CONSULT NOTE  Patient Care Team: Patient, No Pcp Per as PCP - General (General Practice)  CHIEF COMPLAINTS/PURPOSE OF CONSULTATION: Lung mass/history of kidney cancer#   Oncology History Overview Note  # DEC 2020- left chest wall mass; bil mediatisnal LN; bil Lung nodules.   #  2016[facility] ]Renal cell carcinoma, clear cell type, Furhman grade 2 measuring 6.4. It extended into major veins. Negative margins. 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 [CT chetst wall Bx]; Poor risk [anemia;PS;hypercalcemia]; Start RT [10/09/2019]  # JAN 22nd, 2021-Key+Axi  # 2002- cervical cancer LEEP/conization; no chemo-RT  # dec 2020- Hypercalcemia- Ca- 12; Zometa  # NGS/MOLECULAR TESTS: pending.   # PALLIATIVE CARE EVALUATION:P  # PAIN MANAGEMENT: P  DIAGNOSIS: RCC  STAGE:   IV    ;  GOALS: palliative  CURRENT/MOST RECENT THERAPY : key+Axi [C]    Cancer of left kidney ([place])  09/17/2019 Initial Diagnosis   Cancer of left kidney ([place])   10/09/2019 -  Chemotherapy   The patient had pembrolizumab (KEYTRUDA) 200 mg in sodium chloride 0.9 % 50 mL chemo infusion, 200 mg, Intravenous, Once, 1 of 6 cycles Administration: 200 mg (10/09/2019)  for chemotherapy treatment.       HISTORY OF PRESENTING ILLNESS:  Angelica Black 49 y.o.  female stage IV carcinoma of the kidney-is here to proceed with Keytruda.  Patient notes to have improvement of the pain-she is currently status post 2 fractions of radiation.  She continues to be on MS Contin 30 mg 3 times a day and Percocet as needed.  Appetite is fair.   Review of Systems  Constitutional: Positive for weight loss. Negative for chills, diaphoresis, fever and malaise/fatigue.  HENT: Negative for nosebleeds and sore throat.   Eyes: Negative for double vision.  Respiratory: Positive for cough and shortness of breath. Negative for hemoptysis, sputum production  and wheezing.   Cardiovascular: Positive for chest pain. Negative for palpitations, orthopnea and leg swelling.  Gastrointestinal: Negative for abdominal pain, blood in stool, constipation, diarrhea, heartburn, melena, nausea and vomiting.  Genitourinary: Negative for dysuria, frequency and urgency.  Musculoskeletal: Positive for back pain and joint pain.  Skin: Negative.  Negative for itching and rash.  Neurological: Positive for tingling. Negative for dizziness, focal weakness, weakness and headaches.  Endo/Heme/Allergies: Does not bruise/bleed easily.  Psychiatric/Behavioral: Negative for depression. The patient is not nervous/anxious and does not have insomnia.      MEDICAL HISTORY:  Past Medical History:  Diagnosis Date  . Cervical cancer ([place])   . Renal cell carcinoma ([place]) 2016   Pathologic stage from 07/08/2015: Stage Unknown (T3a, NX, cM0) - Signed by Darin Engels, NP on 11/25/2015    SURGICAL HISTORY: Past Surgical History:  Procedure Laterality Date  . CERVICAL BIOPSY  W/ LOOP ELECTRODE EXCISION    . IR IMAGING GUIDED PORT INSERTION  10/05/2019  . PARTIAL NEPHRECTOMY Left 07/08/2015   robotic laparoscopic nephrectomy at [place]: Social History   Socioeconomic History  . Marital status: Divorced    Spouse name: Not on file  . Number of children: Not on file  . Years of education: Not on file  . Highest education level: Not on file  Occupational History  . Not on file  Tobacco Use  . Smoking status: Current Every Day Smoker    Packs/day: 1.00    Years: 35.00    Pack  years: 35.00    Types: Cigarettes  . Smokeless tobacco: Never Used  Substance and Sexual Activity  . Alcohol use: Yes    Comment: ocassional   . Drug use: Not on file  . Sexual activity: Not on file  Other Topics Concern  . 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 Difficulty of Paying Living Expenses: Not on file  Food Insecurity:   . Worried About Charity fundraiser in the Last Year: Not on file  . Ran Out of Food in the Last Year: Not on file  Transportation Needs:   . Lack of Transportation (Medical): Not on file  . Lack of Transportation (Non-Medical): Not on file  Physical Activity:   . Days of Exercise per Week: Not on file  . Minutes of Exercise per Session: Not on file  Stress:   . Feeling of Stress : Not on file  Social Connections:   . Frequency of Communication with Friends and Family: Not on file  . Frequency of Social Gatherings with Friends and Family: Not on file  . Attends Religious Services: Not on file  . Active Member of Clubs or Organizations: Not on file  . Attends Archivist Meetings: Not on file  . Marital Status: Not on file  Intimate Partner Violence:   . Fear of Current or Ex-Partner: Not on file  . Emotionally Abused: Not on file  . Physically Abused: Not on file  . Sexually Abused: Not on file    FAMILY HISTORY: Family History  Problem Relation Age of Onset  . Hypertension Father     ALLERGIES:  has No Known Allergies.  MEDICATIONS:  Current Outpatient Medications  Medication Sig Dispense Refill  . Aspirin-Salicylamide-Caffeine (BC HEADACHE POWDER PO) Take 1 packet by mouth 3 (three) times daily.    Marland Kitchen axitinib (INLYTA) 5 MG tablet Take 1 tablet (5 mg total) by mouth every 12 (twelve) hours. 60 tablet 4  . chlorpheniramine-HYDROcodone (TUSSIONEX) 10-8 MG/5ML SUER Take 5 mLs by mouth at bedtime as needed for cough. 140 mL 0  . citalopram (CELEXA) 20 MG tablet Take 1 tablet (20 mg total) by mouth daily. 30 tablet 3  . lidocaine-prilocaine (EMLA) cream Apply 1 application topically as needed. 30 g 3  . morphine (MS CONTIN) 30 MG 12 hr tablet Take 1 tablet (30 mg total) by mouth every 8 (eight)  hours. 30 tablet 0  . diazepam (VALIUM) 5 MG tablet One pill 45-60 mins prior to procedure; 1 pill 15 mins prior if needed. 4 tablet 0  . ondansetron (ZOFRAN) 8 MG tablet One pill every 8 hours as needed for nausea/vomitting. 40 tablet 1  . oxyCODONE-acetaminophen (PERCOCET) 10-325 MG tablet Take 1 tablet by mouth every 6 (six) hours as needed for pain. 45 tablet 0  . prochlorperazine (COMPAZINE) 10 MG tablet Take 1 tablet (10 mg total) by mouth every 6 (six) hours as needed for nausea or vomiting. 40 tablet 1   No current facility-administered medications for this visit.      Marland Kitchen  PHYSICAL EXAMINATION: ECOG PERFORMANCE STATUS: 1 - Symptomatic but completely ambulatory  Vitals:   10/09/19 1347  BP: 109/66  Pulse: 94  Temp: (!) 96.4 F (35.8 C)   Filed Weights  10/09/19 1347  Weight: 143 lb 12.8 oz (65.2 kg)    Physical Exam  Constitutional: She is oriented to person, place, and time and well-developed, well-nourished, and in no distress.  Thin built female patient.  She is accompanied by her friend.  She is in a wheelchair.  HENT:  Head: Normocephalic and atraumatic.  Mouth/Throat: Oropharynx is clear and moist. No oropharyngeal exudate.  Eyes: Pupils are equal, round, and reactive to light.  Cardiovascular: Normal rate and regular rhythm.  Pulmonary/Chest: No respiratory distress. She has no wheezes.  Decreased air entry bilaterally.  Abdominal: Soft. Bowel sounds are normal. She exhibits no distension and no mass. There is no abdominal tenderness. There is no rebound and no guarding.  Musculoskeletal:        General: No tenderness or edema. Normal range of motion.     Cervical back: Normal range of motion and neck supple.  Neurological: She is alert and oriented to person, place, and time.  Skin: Skin is warm.  Vague mass noted in the left chest wall region.  Tender to touch.  Psychiatric: Affect normal.  Anxious.    LABORATORY DATA:  I have reviewed the data as  listed Lab Results  Component Value Date   WBC 7.1 10/09/2019   HGB 8.8 (L) 10/09/2019   HCT 29.7 (L) 10/09/2019   MCV 91.4 10/09/2019   PLT 372 10/09/2019   Recent Labs    09/16/19 1403 09/30/19 0837 10/09/19 1250  NA 136 135 133*  K 4.2 4.1 4.0  CL 100 104 100  CO2 26 21* 22  GLUCOSE 105* 97 108*  BUN 13 14 11   CREATININE 0.90 0.78 0.70  CALCIUM 12.1* 8.6* 8.9  GFRNONAA >60 >60 >60  GFRAA >60 >60 >60  PROT  --   --  6.6  ALBUMIN  --   --  2.3*  AST  --   --  18  ALT  --   --  8  ALKPHOS  --   --  88  BILITOT  --   --  0.5    RADIOGRAPHIC STUDIES: I have personally reviewed the radiological images as listed and agreed with the findings in the report. DG Chest 2 View  Result Date: 09/16/2019 CLINICAL DATA:  Seizure.  Chest pain and shortness of breath EXAM: CHEST - 2 VIEW COMPARISON:  None. FINDINGS: There are multiple nodular opacities throughout the lungs ranging in size from as small as 4 mm to as large as 2.5 by 2.5 cm. No edema or consolidation. Heart size and pulmonary vascularity are normal. There is equivocal fullness in the left hilum, potentially representing adenopathy. Evidence of old fracture of the lateral left clavicle. No well-defined blastic or lytic bone lesions. No pneumothorax. IMPRESSION: Widespread pulmonary nodular opacities. Appearance suspicious for metastatic disease. Question a degree of hilar adenopathy. It may be prudent to correlate with contrast enhanced chest CT given these findings. No edema or consolidation.  Heart size normal. Electronically Signed   By: Lowella Grip III M.D.   On: 09/16/2019 14:25   CT Angio Chest PE W and/or Wo Contrast  Result Date: 09/16/2019 CLINICAL DATA:  Pleuritic chest pain, abnormal chest x-ray EXAM: CT ANGIOGRAPHY CHEST WITH CONTRAST TECHNIQUE: Multidetector CT imaging of the chest was performed using the standard protocol during bolus administration of intravenous contrast. Multiplanar CT image  reconstructions and MIPs were obtained to evaluate the vascular anatomy. CONTRAST:  52mL OMNIPAQUE IOHEXOL 350 MG/ML SOLN COMPARISON:  Same day chest radiograph  FINDINGS: Cardiovascular: Satisfactory opacification of the pulmonary arteries to the segmental level. No evidence of pulmonary embolism. Normal heart size. Left coronary artery calcifications. No pericardial effusion. Mediastinum/Nodes: Numerous bulky mediastinal and bilateral hilar lymph nodes. Largest subcarinal node measures 3.5 x 2.6 cm (series 5, image 142). There is an enlarged superior mediastinal/paraesophageal lymph node which exerts mass effect on the right aspect of the esophagus measuring approximately 2.3 x 1.7 cm (series 5, image 29). There is a large pleural and anterior mediastinal mass measuring at least 5.1 x 4.9 cm (series 5, image 120). Thyroid gland, trachea, and esophagus demonstrate no significant findings. Lungs/Pleura: Mild emphysema. There are numerous small pulmonary nodules throughout the lungs bilaterally and multiple left-sided pleural nodules and masses. There is a large pleural and chest wall mass of the anterior left upper lobe, with overt destruction of the anterior left second rib, measuring at least 6.9 x 4.6 cm (series 5, image 87). No pleural effusion or pneumothorax. Upper Abdomen: No acute abnormality. Musculoskeletal: No chest wall abnormality. Destructive soft tissue masses involving the anterior left ribs as detailed above. Additional lytic lesions of the left aspect of the sternal body (series 6, image 34, 45). Review of the MIP images confirms the above findings. IMPRESSION: 1. Negative examination for pulmonary embolism. 2. Large pleural and chest wall mass of the anterior left upper lobe, with overt destruction of the anterior left second rib. 3. There is a large adjacent pleural and anterior mediastinal mass measuring at least 5.1 x 4.9 cm (series 5, image 120). 4. Numerous bulky mediastinal and bilateral  hilar lymph nodes, consistent with metastatic disease. 5. Numerous pulmonary nodules. 6. Additional lytic lesions of the left aspect of the sternal body, consistent with additional osseous involvement, likely due to direct extension from adjacent masses. 7. There is an enlarged superior mediastinal/paraesophageal lymph node which exerts mass effect on the right aspect of the esophagus measuring approximately 2.3 x 1.7 cm (series 5, image 29). This may be specifically symptomatic due to location. Correlate with dysphagia. 8. Emphysema (ICD10-J43.9). Electronically Signed   By: Eddie Candle M.D.   On: 09/16/2019 17:27   CT Biopsy  Result Date: 09/24/2019 INDICATION: Large anterior left chest wall mediastinal mass EXAM: CT biopsy left anterior chest wall mass MEDICATIONS: 1% lidocaine local ANESTHESIA/SEDATION: Moderate (conscious) sedation was employed during this procedure. A total of Versed 2.0 mg and Fentanyl 100 mcg was administered intravenously. Moderate Sedation Time: 11 minutes. The patient's level of consciousness and vital signs were monitored continuously by radiology nursing throughout the procedure under my direct supervision. FLUOROSCOPY TIME:  Fluoroscopy Time: None. COMPLICATIONS: None immediate. PROCEDURE: Informed written consent was obtained from the patient after a thorough discussion of the procedural risks, benefits and alternatives. All questions were addressed. Maximal Sterile Barrier Technique was utilized including caps, mask, sterile gowns, sterile gloves, sterile drape, hand hygiene and skin antiseptic. A timeout was performed prior to the initiation of the procedure. Previous imaging reviewed. Patient positioned supine. Noncontrast localization CT performed. The anterior left chest wall invasive mass was localized and marked. Under sterile conditions and local anesthesia, a 17 gauge coaxial guide was advanced from an anterior parasternal approach to the lesion. Needle position confirmed  with CT. 18 gauge core biopsies obtained. Samples were intact and non fragmented. These were placed in formalin. Needle removed. Postprocedure imaging demonstrates no hemorrhage or hematoma. Patient tolerated the biopsy IMPRESSION: Successful CT-guided left anterior chest wall invasive mass 18 gauge core biopsies Electronically Signed   By:  M.  Shick M.D.   On: 09/24/2019 11:41   IR IMAGING GUIDED PORT INSERTION  Result Date: 10/05/2019 INDICATION: 49 year old female referred for port placement EXAM: IMPLANTED PORT A CATH PLACEMENT WITH ULTRASOUND AND FLUOROSCOPIC GUIDANCE MEDICATIONS: 2 G ANCEF; The antibiotic was administered within an appropriate time interval prior to skin puncture. ANESTHESIA/SEDATION: Moderate (conscious) sedation was employed during this procedure. A total of Versed 3.0 mg and Fentanyl 100 mcg was administered intravenously. Moderate Sedation Time: 17 minutes. The patient's level of consciousness and vital signs were monitored continuously by radiology nursing throughout the procedure under my direct supervision. FLUOROSCOPY TIME:  0 minutes, 18 seconds (5.3 mGy) COMPLICATIONS: NONE PROCEDURE: The procedure, risks, benefits, and alternatives were explained to the patient. Questions regarding the procedure were encouraged and answered. The patient understands and consents to the procedure. Ultrasound survey was performed with images stored and sent to PACs. The right neck and chest was prepped with chlorhexidine, and draped in the usual sterile fashion using maximum barrier technique (cap and mask, sterile gown, sterile gloves, large sterile sheet, hand hygiene and cutaneous antiseptic). Antibiotic prophylaxis was provided with 2.0g Ancef administered IV one hour prior to skin incision. Local anesthesia was attained by infiltration with 1% lidocaine without epinephrine. Ultrasound demonstrated patency of the right internal jugular vein, and this was documented with an image. Under  real-time ultrasound guidance, this vein was accessed with a 21 gauge micropuncture needle and image documentation was performed. A small dermatotomy was made at the access site with an 11 scalpel. A 0.018" wire was advanced into the SVC and used to estimate the length of the internal catheter. The access needle exchanged for a 81F micropuncture vascular sheath. The 0.018" wire was then removed and a 0.035" wire advanced into the IVC. An appropriate location for the subcutaneous reservoir was selected below the clavicle and an incision was made through the skin and underlying soft tissues. The subcutaneous tissues were then dissected using a combination of blunt and sharp surgical technique and a pocket was formed. A single lumen power injectable portacatheter was then tunneled through the subcutaneous tissues from the pocket to the dermatotomy and the port reservoir placed within the subcutaneous pocket. The venous access site was then serially dilated and a peel away vascular sheath placed over the wire. The wire was removed and the port catheter advanced into position under fluoroscopic guidance. The catheter tip is positioned in the cavoatrial junction. This was documented with a spot image. The portacatheter was then tested and found to flush and aspirate well. The port was flushed with saline followed by 100 units/mL heparinized saline. The pocket was then closed in two layers using first subdermal inverted interrupted absorbable sutures followed by a running subcuticular suture. The epidermis was then sealed with Dermabond. The dermatotomy at the venous access site was also seal with Dermabond. Patient tolerated the procedure well and remained hemodynamically stable throughout. No complications encountered and no significant blood loss encountered IMPRESSION: Status post right IJ port catheter placement. Signed, Dulcy Fanny. Dellia Nims, RPVI [facility] Radiology  Electronically Signed   By: Corrie Mckusick D.O.   On: 10/05/2019 16:43    ASSESSMENT & PLAN:   Cancer of left kidney ([place]) #Metastatic renal cell carcinoma-stage IV [lung nodules chest wall mass]; poor risk.  Awaiting- MRI brain; CT abdomen pelvis-we will reschedule when patient's convenience.  Currently on axitinib plus Keytruda.   #Proceed with Keytruda cycle #1. Labs today reviewed;  acceptable for  treatment today.   #Left chest wall pain-likely malignancy.  Stable on MS Contin to 30 mg 3 times a day; Percocet as needed.  Current on radiation.    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  Hold Zometa.  I spoke at length with the patient's friend, Velna Hatchet-  regarding the patient's clinical status/plan of care.  # DISPOSITION: # joli- nutrition referral # Keytruda today #  Skeletal survey today # Re-schedule MRI brain/CT A/P [facility]  # Follow up IN 3 weeks; MD: labs- cbc/cmp/LDH;Keytruda- Dr.B     All questions were answered. The patient knows to call the clinic with any problems, questions or concerns.    Cammie Sickle, MD 10/12/2019 4:03 PM

## 2019-10-12 ENCOUNTER — Other Ambulatory Visit: Payer: Self-pay

## 2019-10-12 ENCOUNTER — Telehealth: Payer: Self-pay | Admitting: Internal Medicine

## 2019-10-12 ENCOUNTER — Telehealth: Payer: Self-pay

## 2019-10-12 ENCOUNTER — Ambulatory Visit
Admission: RE | Admit: 2019-10-12 | Discharge: 2019-10-12 | Disposition: A | Discharge status: Home / Self Care | Payer: Self-pay | Source: Ambulatory Visit | Attending: Radiation Oncology | Admitting: Radiation Oncology

## 2019-10-12 ENCOUNTER — Telehealth: Payer: Self-pay | Admitting: *Deleted

## 2019-10-12 MED ORDER — DIAZEPAM 5 MG PO TABS: ORAL_TABLET | ORAL | 0 refills | Status: DC

## 2019-10-12 NOTE — Telephone Encounter (Signed)
Telephone call to pt for follow up after receiving first infusion on Friday.   Pt states did well with infusion and feels no different today.  Eating and drinking well.  Encouraged pt to call for any questions or concerns.

## 2019-10-12 NOTE — Telephone Encounter (Signed)
Valium rx faxed to pharmacy - brenda notify pt

## 2019-10-12 NOTE — Telephone Encounter (Signed)
Call returned to patient to inform of prescription sent

## 2019-10-12 NOTE — Telephone Encounter (Signed)
Family requesting sedative for patient to have MRI tomorrow. Please advise

## 2019-10-12 NOTE — Telephone Encounter (Signed)
Will prescribe valium- please inform pt.  Angelica Black

## 2019-10-13 ENCOUNTER — Other Ambulatory Visit: Payer: Self-pay | Admitting: Internal Medicine

## 2019-10-13 ENCOUNTER — Ambulatory Visit: Payer: Self-pay

## 2019-10-13 ENCOUNTER — Encounter: Payer: Self-pay | Admitting: Oncology

## 2019-10-13 ENCOUNTER — Other Ambulatory Visit: Payer: Self-pay | Admitting: Hospice and Palliative Medicine

## 2019-10-13 ENCOUNTER — Ambulatory Visit
Admission: RE | Admit: 2019-10-13 | Discharge: 2019-10-13 | Disposition: A | Discharge status: Home / Self Care | Payer: Self-pay | Source: Ambulatory Visit | Attending: Internal Medicine | Admitting: Internal Medicine

## 2019-10-13 ENCOUNTER — Ambulatory Visit
Admission: RE | Admit: 2019-10-13 | Discharge: 2019-10-13 | Disposition: A | Discharge status: Home / Self Care | Payer: Medicaid Other | Source: Ambulatory Visit | Attending: Internal Medicine | Admitting: Internal Medicine

## 2019-10-13 DIAGNOSIS — R531 Weakness: Secondary | ICD-10-CM | POA: Insufficient documentation

## 2019-10-13 DIAGNOSIS — C642 Malignant neoplasm of left kidney, except renal pelvis: Secondary | ICD-10-CM | POA: Insufficient documentation

## 2019-10-13 MED ORDER — GADOBUTROL 1 MMOL/ML IV SOLN
6.0000 mL | Freq: Once | INTRAVENOUS | Status: AC | PRN
  Administered 2019-10-13: 6 mL via INTRAVENOUS

## 2019-10-13 MED ORDER — OXYCODONE-ACETAMINOPHEN 10-325 MG PO TABS: 1.0000 | ORAL_TABLET | Freq: Four times a day (QID) | ORAL | 0 refills | Status: DC | PRN

## 2019-10-13 MED ORDER — IOHEXOL 300 MG/ML  SOLN
100.0000 mL | Freq: Once | INTRAMUSCULAR | Status: AC | PRN
  Administered 2019-10-13: 100 mL via INTRAVENOUS

## 2019-10-13 MED ORDER — TRAZODONE HCL 50 MG PO TABS: 50.0000 mg | ORAL_TABLET | Freq: Every evening | ORAL | 2 refills | Status: DC | PRN

## 2019-10-13 NOTE — Progress Notes (Signed)
I called and spoke with patient's daughter who gave me the number for patient's friend - Angelica Black [phone])  Patient has had increased pain requiring Percocet Q6H. I had previously discussed with her increasing the frequency if needed. Will refill.   Patient is also reportedly having difficulty sleeping. Will start prn trazodone.

## 2019-10-14 ENCOUNTER — Ambulatory Visit
Admission: RE | Admit: 2019-10-14 | Discharge: 2019-10-14 | Disposition: A | Discharge status: Home / Self Care | Payer: Self-pay | Source: Ambulatory Visit | Attending: Radiation Oncology | Admitting: Radiation Oncology

## 2019-10-14 ENCOUNTER — Inpatient Hospital Stay (HOSPITAL_BASED_OUTPATIENT_CLINIC_OR_DEPARTMENT_OTHER): Payer: Self-pay | Admitting: Internal Medicine

## 2019-10-14 ENCOUNTER — Encounter: Payer: Self-pay | Admitting: *Deleted

## 2019-10-14 ENCOUNTER — Other Ambulatory Visit: Payer: Self-pay | Admitting: Internal Medicine

## 2019-10-14 ENCOUNTER — Telehealth: Payer: Self-pay | Admitting: Internal Medicine

## 2019-10-14 ENCOUNTER — Inpatient Hospital Stay: Payer: Self-pay | Admitting: Hospice and Palliative Medicine

## 2019-10-14 ENCOUNTER — Encounter: Payer: Self-pay | Admitting: Internal Medicine

## 2019-10-14 ENCOUNTER — Other Ambulatory Visit (HOSPITAL_COMMUNITY): Payer: Medicaid Other

## 2019-10-14 ENCOUNTER — Ambulatory Visit: Payer: Medicaid Other

## 2019-10-14 ENCOUNTER — Other Ambulatory Visit: Payer: Self-pay | Admitting: *Deleted

## 2019-10-14 ENCOUNTER — Inpatient Hospital Stay
Admission: RE | Admit: 2019-10-14 | Discharge: 2019-10-14 | Disposition: A | Discharge status: Home / Self Care | Payer: Medicaid Other | Source: Ambulatory Visit | Attending: Radiation Oncology | Admitting: Radiation Oncology

## 2019-10-14 ENCOUNTER — Other Ambulatory Visit: Payer: Self-pay

## 2019-10-14 DIAGNOSIS — C7931 Secondary malignant neoplasm of brain: Secondary | ICD-10-CM

## 2019-10-14 DIAGNOSIS — C642 Malignant neoplasm of left kidney, except renal pelvis: Secondary | ICD-10-CM

## 2019-10-14 MED ORDER — DEXAMETHASONE 4 MG PO TABS: ORAL_TABLET | ORAL | 0 refills | Status: DC

## 2019-10-14 NOTE — Assessment & Plan Note (Addendum)
#   Metastatic renal cell carcinoma-stage IV [lung nodules chest wall mass]; poor risk.  Currently on Keytruda plus axitinib s/p cycle one approximately 5 days ago.   #Tolerated immunotherapy fairly well; no major adverse events noted.  Proceed with immunotherapy as planned  # Brain MRI-solitary brain metastasis/hemorrhagic with edema-likely secondary to [place].  Recommend SBRT.  Patient not a candidate for surgery given extensive systemic disease.  Discussed with Dr. Donella Stade.  Would recommend a low dose of steroids given ongoing immunotherapy.  Recommend dexamethasone 4 mg a day.   # Left chest wall pain-likely malignancy-poorly controlled as per patient.  Currently on MS Contin to 30 mg 3 times a day; Percocet 10/325. Pharmacy cites insurance issues; initiate new prescription of Percocet 10/325 every 6 hours-patient instructed not to use more frequently than recommended.  Discussed with Josh; patient declined appointment with Josh.  Discussed with radiation oncology regarding getting the right hip lesion.  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  Zometa every month  # I spoke at length with the patient's friend, Velna Hatchet-  regarding the patient's clinical status/plan of care.  # DISPOSITION: # follow up as planned- Dr.B  # I reviewed the blood work- with the patient in detail; also reviewed the imaging independently [as summarized above]; and with the patient in detail.

## 2019-10-14 NOTE — Telephone Encounter (Signed)
Reviewed the MRI brain results with patient- Solitary brain met.  Would recommend radiation.  Patient states the pain is not well controlled/we will have the patient follow-up in the clinic this afternoon with me.  Patient currently getting radiation for chest wall.  Left a message for Dr. Donella Stade.  C-please schedule appointment with me this afternoon after her radiation appointment.

## 2019-10-14 NOTE — Progress Notes (Signed)
Radiation Oncology Follow up Note  Name: Angelica Black   Date:   10/14/2019 MRN:  [id number] DOB: 1971-08-28   Old patient new area (OP N/A) brain met   This 49 y.o. 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  REFERRING PROVIDER: No ref. provider found  HPI: Patient is a 49 year old female currently undergoing treatment for palliation to a large anterior mediastinal mass involving the chest wall and patient with known history of stage IV renal cell carcinoma resected in 2016 at [facility].  As part of her work-up.  She had a brain MRI scan showing a 1.5 x 1.4 x 1.1 cm lesion in the anterior left frontal lobe consistent with solitary brain metastasis.  It had surrounding edema and some's mild regional mass-effect.  No midline shift or herniation was noted.  She is fairly asymptomatic specifically denies any any change in visual fields or any focal neurologic deficits.  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  Well-developed well-nourished patient in NAD. HEENT reveals PERLA, EOMI, discs not visualized.  Oral cavity is clear. No oral mucosal lesions are identified. Neck is clear without evidence of cervical or supraclavicular adenopathy. Lungs are clear to A&P. Cardiac examination is essentially unremarkable with regular rate and rhythm without murmur rub or thrill. Abdomen is benign with no organomegaly or masses noted. Motor sensory and DTR levels are  equal and symmetric in the upper and lower extremities. Cranial nerves II through XII are grossly intact. Proprioception is intact. No peripheral adenopathy or edema is identified. No motor or sensory levels are noted. Crude visual fields are within normal range.  RADIOLOGY RESULTS: MRI scan reviewed.  PLAN: Present time I to head with SRS.  Would plan on delivering 20 Gy in 1 fraction.  We are ordering a MRI scan for treatment planning purposes to be performed next week we will send the patient the following day.  Risks and benefits of treatment including possible fatigue possible hair loss slight chance of radionecrosis all were discussed in detail with the patient.  She seems to comprehend my treatment plan well.  I have asked the patient to take Valium from her MRI scan and for the initial CT scan which I have already ordered.  We also have her on Valium the day of her single treatment.  Patient comprehends my treatment plan and recommendations well patient's daughter was present.  I would like to take this opportunity to thank you for allowing me to participate in the care of your patient.Noreene Filbert, MD

## 2019-10-14 NOTE — Telephone Encounter (Signed)
Apt added at 115 pm today with Dr. Jacinto Reap

## 2019-10-15 ENCOUNTER — Ambulatory Visit
Admission: RE | Admit: 2019-10-15 | Discharge: 2019-10-15 | Disposition: A | Discharge status: Home / Self Care | Payer: Self-pay | Source: Ambulatory Visit | Attending: Radiation Oncology | Admitting: Radiation Oncology

## 2019-10-15 ENCOUNTER — Other Ambulatory Visit: Payer: Self-pay

## 2019-10-15 MED ORDER — MORPHINE SULFATE ER 30 MG PO TBCR: 30.0000 mg | EXTENDED_RELEASE_TABLET | Freq: Three times a day (TID) | ORAL | 0 refills | Status: DC

## 2019-10-15 NOTE — Progress Notes (Signed)
[facility] CONSULT NOTE  Patient Care Team: Patient, No Pcp Per as PCP - General (General Practice)  CHIEF COMPLAINTS/PURPOSE OF CONSULTATION: Lung mass/history of kidney cancer#   Oncology History Overview Note  # DEC 2020- left chest wall mass; bil mediatisnal LN; bil Lung nodules.   #  2016[facility] ]Renal cell carcinoma, clear cell type, Furhman grade 2 measuring 6.4. It extended into major veins. Negative margins. 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 [CT chest wall Bx]; Poor risk [anemia;PS;hypercalcemia]; Start RT [10/09/2019]; CT abdomen pelvis-multiple lung nodules; right pelvic lesion.   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   # PALLIATIVE CARE EVALUATION: Josh  # PAIN MANAGEMENT: P  DIAGNOSIS: RCC  STAGE:   IV    ;  GOALS: palliative  CURRENT/MOST RECENT THERAPY : key+Axi [C]    Cancer of left kidney ([place])  09/17/2019 Initial Diagnosis   Cancer of left kidney ([place])   10/09/2019 -  Chemotherapy   The patient had pembrolizumab (KEYTRUDA) 200 mg in sodium chloride 0.9 % 50 mL chemo infusion, 200 mg, Intravenous, Once, 1 of 6 cycles Administration: 200 mg (10/09/2019)  for chemotherapy treatment.       HISTORY OF PRESENTING ILLNESS:  Angelica Black 49 y.o.  female stage IV carcinoma of the kidney is here today with results of the brain MRI/CT imaging of the pelvis.  Patient notes to have worsening of the pain right chest wall right hip.  Patient has been taking MS Contin 3 times a day; however she has been taking Percocet more frequently.  Recently ran out of Percocet.  She has been unable to fill her Percocet which is ordered on 1/26-because of insurance/payment.  Denies any  headaches.  Denies any nausea vomiting but no diarrhea.  No rash.   Review of Systems  Constitutional: Positive for weight loss. Negative for chills, diaphoresis, fever and malaise/fatigue.  HENT: Negative for nosebleeds and sore throat.   Eyes: Negative for double vision.  Respiratory: Positive for cough and shortness of breath. Negative for hemoptysis, sputum production and wheezing.   Cardiovascular: Positive for chest pain. Negative for palpitations, orthopnea and leg swelling.  Gastrointestinal: Negative for abdominal pain, blood in stool, constipation, diarrhea, heartburn, melena, nausea and vomiting.  Genitourinary: Negative for dysuria, frequency and urgency.  Musculoskeletal: Positive for back pain and joint pain.  Skin: Negative.  Negative for itching and rash.  Neurological: Positive for tingling. Negative for dizziness, focal weakness, weakness and headaches.  Endo/Heme/Allergies: Does not bruise/bleed easily.  Psychiatric/Behavioral: Negative for depression. The patient is not nervous/anxious and does not have insomnia.      MEDICAL HISTORY:  Past Medical History:  Diagnosis Date  . Cervical cancer ([place])   . Renal cell carcinoma ([place]) 2016   Pathologic stage from 07/08/2015: Stage Unknown (T3a, NX, cM0) - Signed by Darin Engels, NP on 11/25/2015    SURGICAL HISTORY: Past Surgical History:  Procedure Laterality Date  . CERVICAL BIOPSY  W/ LOOP ELECTRODE EXCISION    . IR IMAGING GUIDED PORT INSERTION  10/05/2019  . PARTIAL NEPHRECTOMY Left 07/08/2015   robotic laparoscopic nephrectomy at [place]: Social History   Socioeconomic History  . Marital status: Divorced    Spouse  name: Not on file  . Number of children: Not on file  . Years of education: Not on file  . Highest education level: Not on file  Occupational History  . Not on file  Tobacco Use  . Smoking status: Current Every Day Smoker    Packs/day: 1.00    Years: 35.00    Pack  years: 35.00    Types: Cigarettes  . Smokeless tobacco: Never Used  Substance and Sexual Activity  . Alcohol use: Yes    Comment: ocassional   . Drug use: Not on file  . Sexual activity: Not on file  Other Topics Concern  . 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 Difficulty of Paying Living Expenses: Not on file  Food Insecurity:   . Worried About Charity fundraiser in the Last Year: Not on file  . Ran Out of Food in the Last Year: Not on file  Transportation Needs:   . Lack of Transportation (Medical): Not on file  . Lack of Transportation (Non-Medical): Not on file  Physical Activity:   . Days of Exercise per Week: Not on file  . Minutes of Exercise per Session: Not on file  Stress:   . Feeling of Stress : Not on file  Social Connections:   . Frequency of Communication with Friends and Family: Not on file  . Frequency of Social Gatherings with Friends and Family: Not on file  . Attends Religious Services: Not on file  . Active Member of Clubs or Organizations: Not on file  . Attends Archivist Meetings: Not on file  . Marital Status: Not on file  Intimate Partner Violence:   . Fear of Current or Ex-Partner: Not on file  . Emotionally Abused: Not on file  . Physically Abused: Not on file  . Sexually Abused: Not on file    FAMILY HISTORY: Family History  Problem Relation Age of Onset  . Hypertension Father     ALLERGIES:  has No Known Allergies.  MEDICATIONS:  Current Outpatient Medications  Medication Sig Dispense Refill  . Aspirin-Salicylamide-Caffeine (BC HEADACHE POWDER PO) Take 1 packet by mouth 3 (three) times daily.    Marland Kitchen axitinib (INLYTA) 5 MG tablet Take 1 tablet (5 mg total) by mouth every 12 (twelve) hours. 60 tablet 4  .  citalopram (CELEXA) 20 MG tablet TAKE 1 TABLET BY MOUTH EVERY DAY 90 tablet 2  . diazepam (VALIUM) 5 MG tablet One pill 45-60 mins prior to procedure; 1 pill 15 mins prior if needed. 4 tablet 0  . lidocaine-prilocaine (EMLA) cream Apply 1 application topically as needed. 30 g 3  . morphine (MS CONTIN) 30 MG 12 hr tablet Take 1 tablet (30 mg total) by mouth every 8 (eight) hours. 30 tablet 0  . ondansetron (ZOFRAN) 8 MG tablet One pill every 8 hours as needed for nausea/vomitting. 40 tablet 1  . oxyCODONE-acetaminophen (PERCOCET) 10-325 MG tablet Take 1 tablet by mouth every 6 (six) hours as needed for pain. 60 tablet 0  . prochlorperazine (COMPAZINE) 10 MG tablet Take 1 tablet (10 mg total) by mouth every 6 (six) hours as needed for nausea or vomiting. 40 tablet 1  . traZODone (DESYREL) 50 MG tablet Take 1 tablet (50 mg total) by  mouth at bedtime as needed for sleep. 30 tablet 2  . chlorpheniramine-HYDROcodone (TUSSIONEX) 10-8 MG/5ML SUER Take 5 mLs by mouth at bedtime as needed for cough. (Patient not taking: Reported on 10/14/2019) 140 mL 0  . dexamethasone (DECADRON) 4 MG tablet Take one pill in AM with breakfast 14 tablet 0   No current facility-administered medications for this visit.      Marland Kitchen  PHYSICAL EXAMINATION: ECOG PERFORMANCE STATUS: 1 - Symptomatic but completely ambulatory  Vitals:   10/14/19 1350  BP: 112/76  Pulse: 88  Resp: 20  Temp: (!) 95.7 F (35.4 C)   Filed Weights   10/14/19 1350  Weight: 140 lb (63.5 kg)    Physical Exam  Constitutional: She is oriented to person, place, and time and well-developed, well-nourished, and in no distress.  Thin built female patient.  She is accompanied by her friend.  She is in a wheelchair.  HENT:  Head: Normocephalic and atraumatic.  Mouth/Throat: Oropharynx is clear and moist. No oropharyngeal exudate.  Eyes: Pupils are equal, round, and reactive to light.  Cardiovascular: Normal rate and regular rhythm.  Pulmonary/Chest:  No respiratory distress. She has no wheezes.  Decreased air entry bilaterally.  Abdominal: Soft. Bowel sounds are normal. She exhibits no distension and no mass. There is no abdominal tenderness. There is no rebound and no guarding.  Musculoskeletal:        General: No tenderness or edema. Normal range of motion.     Cervical back: Normal range of motion and neck supple.  Neurological: She is alert and oriented to person, place, and time.  Skin: Skin is warm.  Vague mass noted in the left chest wall region.  Tender to touch.  Psychiatric: Affect normal.  Anxious.    LABORATORY DATA:  I have reviewed the data as listed Lab Results  Component Value Date   WBC 7.1 10/09/2019   HGB 8.8 (L) 10/09/2019   HCT 29.7 (L) 10/09/2019   MCV 91.4 10/09/2019   PLT 372 10/09/2019   Recent Labs    09/16/19 1403 09/30/19 0837 10/09/19 1250  NA 136 135 133*  K 4.2 4.1 4.0  CL 100 104 100  CO2 26 21* 22  GLUCOSE 105* 97 108*  BUN 13 14 11   CREATININE 0.90 0.78 0.70  CALCIUM 12.1* 8.6* 8.9  GFRNONAA >60 >60 >60  GFRAA >60 >60 >60  PROT  --   --  6.6  ALBUMIN  --   --  2.3*  AST  --   --  18  ALT  --   --  8  ALKPHOS  --   --  88  BILITOT  --   --  0.5    RADIOGRAPHIC STUDIES: I have personally reviewed the radiological images as listed and agreed with the findings in the report. DG Chest 2 View  Result Date: 09/16/2019 CLINICAL DATA:  Seizure.  Chest pain and shortness of breath EXAM: CHEST - 2 VIEW COMPARISON:  None. FINDINGS: There are multiple nodular opacities throughout the lungs ranging in size from as small as 4 mm to as large as 2.5 by 2.5 cm. No edema or consolidation. Heart size and pulmonary vascularity are normal. There is equivocal fullness in the left hilum, potentially representing adenopathy. Evidence of old fracture of the lateral left clavicle. No well-defined blastic or lytic bone lesions. No pneumothorax. IMPRESSION: Widespread pulmonary nodular opacities. Appearance  suspicious for metastatic disease. Question a degree of hilar adenopathy. It may be prudent to  correlate with contrast enhanced chest CT given these findings. No edema or consolidation.  Heart size normal. Electronically Signed   By: Lowella Grip III M.D.   On: 09/16/2019 14:25   CT Angio Chest PE W and/or Wo Contrast  Result Date: 09/16/2019 CLINICAL DATA:  Pleuritic chest pain, abnormal chest x-ray EXAM: CT ANGIOGRAPHY CHEST WITH CONTRAST TECHNIQUE: Multidetector CT imaging of the chest was performed using the standard protocol during bolus administration of intravenous contrast. Multiplanar CT image reconstructions and MIPs were obtained to evaluate the vascular anatomy. CONTRAST:  38m OMNIPAQUE IOHEXOL 350 MG/ML SOLN COMPARISON:  Same day chest radiograph FINDINGS: Cardiovascular: Satisfactory opacification of the pulmonary arteries to the segmental level. No evidence of pulmonary embolism. Normal heart size. Left coronary artery calcifications. No pericardial effusion. Mediastinum/Nodes: Numerous bulky mediastinal and bilateral hilar lymph nodes. Largest subcarinal node measures 3.5 x 2.6 cm (series 5, image 142). There is an enlarged superior mediastinal/paraesophageal lymph node which exerts mass effect on the right aspect of the esophagus measuring approximately 2.3 x 1.7 cm (series 5, image 29). There is a large pleural and anterior mediastinal mass measuring at least 5.1 x 4.9 cm (series 5, image 120). Thyroid gland, trachea, and esophagus demonstrate no significant findings. Lungs/Pleura: Mild emphysema. There are numerous small pulmonary nodules throughout the lungs bilaterally and multiple left-sided pleural nodules and masses. There is a large pleural and chest wall mass of the anterior left upper lobe, with overt destruction of the anterior left second rib, measuring at least 6.9 x 4.6 cm (series 5, image 87). No pleural effusion or pneumothorax. Upper Abdomen: No acute abnormality.  Musculoskeletal: No chest wall abnormality. Destructive soft tissue masses involving the anterior left ribs as detailed above. Additional lytic lesions of the left aspect of the sternal body (series 6, image 34, 45). Review of the MIP images confirms the above findings. IMPRESSION: 1. Negative examination for pulmonary embolism. 2. Large pleural and chest wall mass of the anterior left upper lobe, with overt destruction of the anterior left second rib. 3. There is a large adjacent pleural and anterior mediastinal mass measuring at least 5.1 x 4.9 cm (series 5, image 120). 4. Numerous bulky mediastinal and bilateral hilar lymph nodes, consistent with metastatic disease. 5. Numerous pulmonary nodules. 6. Additional lytic lesions of the left aspect of the sternal body, consistent with additional osseous involvement, likely due to direct extension from adjacent masses. 7. There is an enlarged superior mediastinal/paraesophageal lymph node which exerts mass effect on the right aspect of the esophagus measuring approximately 2.3 x 1.7 cm (series 5, image 29). This may be specifically symptomatic due to location. Correlate with dysphagia. 8. Emphysema (ICD10-J43.9). Electronically Signed   By: AEddie CandleM.D.   On: 09/16/2019 17:27   MR Brain W Wo Contrast  Result Date: 10/13/2019 CLINICAL DATA:  Renal cancer EXAM: MRI HEAD WITHOUT AND WITH CONTRAST TECHNIQUE: Multiplanar, multiecho pulse sequences of the brain and surrounding structures were obtained without and with intravenous contrast. CONTRAST:  656mGADAVIST GADOBUTROL 1 MMOL/ML IV SOLN COMPARISON:  None. FINDINGS: Brain: There is a single enhancing lesion in the anterior left frontal lobe white matter measuring approximately 1.5 x 1.4 x 1.1 cm. There is associated susceptibility reflecting intralesional blood products. Moderate surrounding edema with mild regional mass effect. No midline shift or herniation. There is no acute infarction. There is no  hydrocephalus or extra-axial fluid collection. No abnormal leptomeningeal enhancement. Vascular: Major vessel flow voids at the skull base are preserved. Skull  and upper cervical spine: Nonspecific subcentimeter foci of enhancement posterior to the coronal suture on the left (series 18, image 156) and posterior right parietal calvarium (image 149). Sinuses/Orbits: Minor mucosal thickening.  Orbits are unremarkable. Other: Sella is unremarkable. Minimal left mastoid fluid opacification. IMPRESSION: Single anterior left frontal lobe parenchymal metastasis with intralesional hemorrhage. Moderate surrounding edema without substantial mass effect. Two nonspecific foci of calvarial enhancement. Electronically Signed   By: Macy Mis M.D.   On: 10/13/2019 16:57   CT ABDOMEN PELVIS W CONTRAST  Result Date: 10/13/2019 CLINICAL DATA:  Renal cell carcinoma metastasis. LEFT nephrectomy. Chemotherapy ongoing EXAM: CT ABDOMEN AND PELVIS WITH CONTRAST TECHNIQUE: Multidetector CT imaging of the abdomen and pelvis was performed using the standard protocol following bolus administration of intravenous contrast. CONTRAST:  110m OMNIPAQUE IOHEXOL 300 MG/ML  SOLN COMPARISON:  Chest CT 09/16/2019 FINDINGS: Lower chest: Innumerable pulmonary nodules at the lung bases. Extensive pleural thickening at the LEFT lung base measuring up to 1.9 cm (image 4/2). Pleural thickening involves the cardiac border. Hepatobiliary: Mild intrahepatic and extrahepatic biliary duct dilatation. No obstructing lesion identified. Gallbladder contains a gallstone. No gallbladder distension Pancreas: Pancreas is normal. No ductal dilatation. No pancreatic inflammation. Spleen: Normal spleen Adrenals/urinary tract: Post LEFT adrenalectomy and nephrectomy. There is nodular thickening in the LEFT suprarenal at the splenic hilum which is favored the tail the pancreas. (Image 14/2). RIGHT kidney is normal. RIGHT ureter and bladder normal. Stomach/Bowel:  Stomach, small bowel, appendix, and cecum are normal. The colon and rectosigmoid colon are normal. Vascular/Lymphatic: Abdominal aorta is normal caliber with atherosclerotic calcification. There is no retroperitoneal or periportal lymphadenopathy. No pelvic lymphadenopathy. Reproductive: Uterus and ovaries normal Other: No free fluid Musculoskeletal: Aggressive lesion in the RIGHT iliac wing number ups through the inter outer cortex and measures 4 cm in width (image 50/2). Lytic lesion at L1 measures 1.3 cm (image 77/2 ) IMPRESSION: 1. Widespread bilateral nodular pulmonary metastasis at the lung bases. 2. Extensive pleural metastasis involving the LEFT lung base pleural surface. 3. Post LEFT nephrectomy. No evidence of local recurrence. Nodularity in the splenic hilum is favored the tail of the pancreas. 4. Bone metastasis with a soft tissue in the RIGHT iliac crest. Lytic lesion at L1. 5. Mild intrahepatic and extrahepatic biliary duct dilatation without obstructing lesion. Gallstone without evidence of cholecystitis. 6. Normal RIGHT kidney. Electronically Signed   By: SSuzy BouchardM.D.   On: 10/13/2019 17:05   CT Biopsy  Result Date: 09/24/2019 INDICATION: Large anterior left chest wall mediastinal mass EXAM: CT biopsy left anterior chest wall mass MEDICATIONS: 1% lidocaine local ANESTHESIA/SEDATION: Moderate (conscious) sedation was employed during this procedure. A total of Versed 2.0 mg and Fentanyl 100 mcg was administered intravenously. Moderate Sedation Time: 11 minutes. The patient's level of consciousness and vital signs were monitored continuously by radiology nursing throughout the procedure under my direct supervision. FLUOROSCOPY TIME:  Fluoroscopy Time: None. COMPLICATIONS: None immediate. PROCEDURE: Informed written consent was obtained from the patient after a thorough discussion of the procedural risks, benefits and alternatives. All questions were addressed. Maximal Sterile Barrier  Technique was utilized including caps, mask, sterile gowns, sterile gloves, sterile drape, hand hygiene and skin antiseptic. A timeout was performed prior to the initiation of the procedure. Previous imaging reviewed. Patient positioned supine. Noncontrast localization CT performed. The anterior left chest wall invasive mass was localized and marked. Under sterile conditions and local anesthesia, a 17 gauge coaxial guide was advanced from an anterior parasternal approach to the  lesion. Needle position confirmed with CT. 18 gauge core biopsies obtained. Samples were intact and non fragmented. These were placed in formalin. Needle removed. Postprocedure imaging demonstrates no hemorrhage or hematoma. Patient tolerated the biopsy IMPRESSION: Successful CT-guided left anterior chest wall invasive mass 18 gauge core biopsies Electronically Signed   By: Jerilynn Mages.  Shick M.D.   On: 09/24/2019 11:41   IR IMAGING GUIDED PORT INSERTION  Result Date: 10/05/2019 INDICATION: 49 year old female referred for port placement EXAM: IMPLANTED PORT A CATH PLACEMENT WITH ULTRASOUND AND FLUOROSCOPIC GUIDANCE MEDICATIONS: 2 G ANCEF; The antibiotic was administered within an appropriate time interval prior to skin puncture. ANESTHESIA/SEDATION: Moderate (conscious) sedation was employed during this procedure. A total of Versed 3.0 mg and Fentanyl 100 mcg was administered intravenously. Moderate Sedation Time: 17 minutes. The patient's level of consciousness and vital signs were monitored continuously by radiology nursing throughout the procedure under my direct supervision. FLUOROSCOPY TIME:  0 minutes, 18 seconds (5.3 mGy) COMPLICATIONS: NONE PROCEDURE: The procedure, risks, benefits, and alternatives were explained to the patient. Questions regarding the procedure were encouraged and answered. The patient understands and consents to the procedure. Ultrasound survey was performed with images stored and sent to PACs. The right neck and chest  was prepped with chlorhexidine, and draped in the usual sterile fashion using maximum barrier technique (cap and mask, sterile gown, sterile gloves, large sterile sheet, hand hygiene and cutaneous antiseptic). Antibiotic prophylaxis was provided with 2.0g Ancef administered IV one hour prior to skin incision. Local anesthesia was attained by infiltration with 1% lidocaine without epinephrine. Ultrasound demonstrated patency of the right internal jugular vein, and this was documented with an image. Under real-time ultrasound guidance, this vein was accessed with a 21 gauge micropuncture needle and image documentation was performed. A small dermatotomy was made at the access site with an 11 scalpel. A 0.018" wire was advanced into the SVC and used to estimate the length of the internal catheter. The access needle exchanged for a 1F micropuncture vascular sheath. The 0.018" wire was then removed and a 0.035" wire advanced into the IVC. An appropriate location for the subcutaneous reservoir was selected below the clavicle and an incision was made through the skin and underlying soft tissues. The subcutaneous tissues were then dissected using a combination of blunt and sharp surgical technique and a pocket was formed. A single lumen power injectable portacatheter was then tunneled through the subcutaneous tissues from the pocket to the dermatotomy and the port reservoir placed within the subcutaneous pocket. The venous access site was then serially dilated and a peel away vascular sheath placed over the wire. The wire was removed and the port catheter advanced into position under fluoroscopic guidance. The catheter tip is positioned in the cavoatrial junction. This was documented with a spot image. The portacatheter was then tested and found to flush and aspirate well. The port was flushed with saline followed by 100 units/mL heparinized saline. The pocket was then closed in two layers using first subdermal inverted  interrupted absorbable sutures followed by a running subcuticular suture. The epidermis was then sealed with Dermabond. The dermatotomy at the venous access site was also seal with Dermabond. Patient tolerated the procedure well and remained hemodynamically stable throughout. No complications encountered and no significant blood loss encountered IMPRESSION: Status post right IJ port catheter placement. Signed, Dulcy Fanny. Dellia Nims, RPVI [facility] Electronically Signed   By: Corrie Mckusick D.O.   On: 10/05/2019 16:43  ASSESSMENT & PLAN:   Cancer of left kidney ([place]) # Metastatic renal cell carcinoma-stage IV [lung nodules chest wall mass]; poor risk.  Currently on Keytruda plus axitinib s/p cycle one approximately 5 days ago.   #Tolerated immunotherapy fairly well; no major adverse events noted.  Proceed with immunotherapy as planned  # Brain MRI-solitary brain metastasis/hemorrhagic with edema-likely secondary to [place].  Recommend SBRT.  Patient not a candidate for surgery given extensive systemic disease.  Discussed with Dr. Donella Stade.  Would recommend a low dose of steroids given ongoing immunotherapy.  Recommend dexamethasone 4 mg a day.   # Left chest wall pain-likely malignancy-poorly controlled as per patient.  Currently on MS Contin to 30 mg 3 times a day; Percocet 10/325. Pharmacy cites insurance issues; initiate new prescription of Percocet 10/325 every 6 hours-patient instructed not to use more frequently than recommended.  Discussed with Josh; patient declined appointment with Josh.  Discussed with radiation oncology regarding getting the right hip lesion.  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  Zometa every month  # I spoke at length with the patient's friend, Velna Hatchet-  regarding the patient's clinical status/plan of care.  # DISPOSITION: # follow up as planned- Dr.B  # I reviewed the blood work-  with the patient in detail; also reviewed the imaging independently [as summarized above]; and with the patient in detail.        All questions were answered. The patient knows to call the clinic with any problems, questions or concerns.    Cammie Sickle, MD 10/15/2019 8:32 AM

## 2019-10-16 ENCOUNTER — Ambulatory Visit: Payer: Self-pay

## 2019-10-16 NOTE — Telephone Encounter (Signed)
I called [employer] to Presenter, broadcasting.  Rep stated that the patient received her first shipment on 10/09/19.   Spoke to patient yesterday after radiation appointment to verify delivery of her Inlyta.  She states that her friend Velna Hatchet was taking care of things for her.  I gave her the approval letter with instructions about calling 7-10 days before she needed a refill and the number to call.  She was very appreciative for all the help getting her medication.  [place] Patient [place] Phone [phone] Fax [phone] 10/16/2019 2:08 PM

## 2019-10-19 ENCOUNTER — Inpatient Hospital Stay: Payer: Self-pay | Attending: Internal Medicine

## 2019-10-19 ENCOUNTER — Ambulatory Visit
Admission: RE | Admit: 2019-10-19 | Discharge: 2019-10-19 | Disposition: A | Discharge status: Home / Self Care | Payer: Medicaid Other | Source: Ambulatory Visit | Attending: Radiation Oncology | Admitting: Radiation Oncology

## 2019-10-19 ENCOUNTER — Other Ambulatory Visit: Payer: Self-pay

## 2019-10-19 DIAGNOSIS — R531 Weakness: Secondary | ICD-10-CM | POA: Insufficient documentation

## 2019-10-19 DIAGNOSIS — Z510 Encounter for antineoplastic radiation therapy: Secondary | ICD-10-CM | POA: Insufficient documentation

## 2019-10-19 DIAGNOSIS — C7802 Secondary malignant neoplasm of left lung: Secondary | ICD-10-CM | POA: Insufficient documentation

## 2019-10-19 DIAGNOSIS — R0789 Other chest pain: Secondary | ICD-10-CM | POA: Insufficient documentation

## 2019-10-19 DIAGNOSIS — C7801 Secondary malignant neoplasm of right lung: Secondary | ICD-10-CM | POA: Insufficient documentation

## 2019-10-19 DIAGNOSIS — C649 Malignant neoplasm of unspecified kidney, except renal pelvis: Secondary | ICD-10-CM | POA: Insufficient documentation

## 2019-10-19 DIAGNOSIS — K5900 Constipation, unspecified: Secondary | ICD-10-CM | POA: Insufficient documentation

## 2019-10-19 DIAGNOSIS — G939 Disorder of brain, unspecified: Secondary | ICD-10-CM | POA: Insufficient documentation

## 2019-10-19 DIAGNOSIS — R630 Anorexia: Secondary | ICD-10-CM | POA: Insufficient documentation

## 2019-10-19 DIAGNOSIS — C7931 Secondary malignant neoplasm of brain: Secondary | ICD-10-CM | POA: Insufficient documentation

## 2019-10-19 DIAGNOSIS — F17210 Nicotine dependence, cigarettes, uncomplicated: Secondary | ICD-10-CM | POA: Insufficient documentation

## 2019-10-19 DIAGNOSIS — C7951 Secondary malignant neoplasm of bone: Secondary | ICD-10-CM | POA: Insufficient documentation

## 2019-10-19 DIAGNOSIS — Z515 Encounter for palliative care: Secondary | ICD-10-CM | POA: Insufficient documentation

## 2019-10-19 DIAGNOSIS — Z8541 Personal history of malignant neoplasm of cervix uteri: Secondary | ICD-10-CM | POA: Insufficient documentation

## 2019-10-19 DIAGNOSIS — Z5112 Encounter for antineoplastic immunotherapy: Secondary | ICD-10-CM | POA: Insufficient documentation

## 2019-10-19 DIAGNOSIS — Z79899 Other long term (current) drug therapy: Secondary | ICD-10-CM | POA: Insufficient documentation

## 2019-10-19 DIAGNOSIS — C642 Malignant neoplasm of left kidney, except renal pelvis: Secondary | ICD-10-CM | POA: Insufficient documentation

## 2019-10-19 NOTE — Progress Notes (Signed)
Nutrition Assessment   Reason for Assessment:  Referral poor appetite   ASSESSMENT:  49 year old female with stage IV carcinoma of kidney.  Past medical history of partial nephrectomy.  Patient receiving Beryle Flock plus axitinib  Called patient at scheduled time for phone appointment and left message.   Called patient back this afternoon and was able to reach patient.   Patient reports appetite up and down.  Reports that overall appetite has improved since diagnosis as feels stress of finding out she has cancer effected appetite.  Reports that she snacks during the day.  Has never been a breakfast eater. Often will have something sweet, cake and friend fixes boost with ice cream.  Later on she may have a cheeseburger.  Likes fruit.  "I have never ate healthy but I am trying now."  Reports that sometimes if she eats too much she gets nauseated.  Has been taking nausea medications.    Medications:  Zofran, compazine, decadron   Labs: reviewed   Anthropometrics:   Height: 68 inches Weight: 140 lb on 1/27 Noted 143 lb on 1/22 Patient reports 8 lb weight loss since diagnosis 08/2019 BMI: 21  5% weight loss in the last month, significant   Estimated Energy Needs  Kcals: 1900-2200 Protein: 95-110 g Fluid: > 1.9 L  NUTRITION DIAGNOSIS:  Inadequate oral intake related to cancer and cancer related treatment side effects as evidenced by 5% weight loss in the last month   INTERVENTION:  Discussed importance of good nutrition. Encouraged small frequent meals. Encouraged good sources of protein and examples of protein foods discussed. Patient to continue oral nutrition supplements Patient has contact information   MONITORING, EVALUATION, GOAL: Patient will consume adequate calories and protein to prevent weight loss   Next Visit: phone f/u March 1  Angelica Black, [place], [place] Registered Dietitian [phone] (pager)

## 2019-10-20 ENCOUNTER — Ambulatory Visit
Admission: RE | Admit: 2019-10-20 | Discharge: 2019-10-20 | Disposition: A | Discharge status: Home / Self Care | Payer: Medicaid Other | Source: Ambulatory Visit | Attending: Radiation Oncology | Admitting: Radiation Oncology

## 2019-10-20 ENCOUNTER — Other Ambulatory Visit: Payer: Self-pay

## 2019-10-21 ENCOUNTER — Other Ambulatory Visit: Payer: Self-pay

## 2019-10-21 ENCOUNTER — Ambulatory Visit
Admission: RE | Admit: 2019-10-21 | Discharge: 2019-10-21 | Disposition: A | Discharge status: Home / Self Care | Payer: Medicaid Other | Source: Ambulatory Visit | Attending: Radiation Oncology | Admitting: Radiation Oncology

## 2019-10-21 ENCOUNTER — Ambulatory Visit: Payer: Medicaid Other

## 2019-10-22 ENCOUNTER — Other Ambulatory Visit: Payer: Self-pay | Admitting: *Deleted

## 2019-10-22 ENCOUNTER — Other Ambulatory Visit: Payer: Self-pay

## 2019-10-22 ENCOUNTER — Ambulatory Visit: Payer: Medicaid Other

## 2019-10-22 ENCOUNTER — Ambulatory Visit
Admission: RE | Admit: 2019-10-22 | Discharge: 2019-10-22 | Disposition: A | Discharge status: Home / Self Care | Payer: Medicaid Other | Source: Ambulatory Visit | Attending: Radiation Oncology | Admitting: Radiation Oncology

## 2019-10-22 ENCOUNTER — Ambulatory Visit
Admission: RE | Admit: 2019-10-22 | Discharge: 2019-10-22 | Disposition: A | Discharge status: Home / Self Care | Payer: Self-pay | Source: Ambulatory Visit | Attending: Radiation Oncology | Admitting: Radiation Oncology

## 2019-10-22 DIAGNOSIS — C7931 Secondary malignant neoplasm of brain: Secondary | ICD-10-CM | POA: Insufficient documentation

## 2019-10-22 MED ORDER — GADOBUTROL 1 MMOL/ML IV SOLN
6.0000 mL | Freq: Once | INTRAVENOUS | Status: AC | PRN
  Administered 2019-10-22: 15:00:00 6 mL via INTRAVENOUS

## 2019-10-23 ENCOUNTER — Other Ambulatory Visit: Payer: Self-pay

## 2019-10-23 ENCOUNTER — Ambulatory Visit
Admission: RE | Admit: 2019-10-23 | Discharge: 2019-10-23 | Disposition: A | Discharge status: Home / Self Care | Payer: Medicaid Other | Source: Ambulatory Visit | Attending: Radiation Oncology | Admitting: Radiation Oncology

## 2019-10-26 ENCOUNTER — Other Ambulatory Visit: Payer: Self-pay | Admitting: *Deleted

## 2019-10-26 ENCOUNTER — Ambulatory Visit
Admission: RE | Admit: 2019-10-26 | Discharge: 2019-10-26 | Disposition: A | Discharge status: Home / Self Care | Payer: Medicaid Other | Source: Ambulatory Visit | Attending: Radiation Oncology | Admitting: Radiation Oncology

## 2019-10-26 ENCOUNTER — Other Ambulatory Visit: Payer: Self-pay

## 2019-10-26 MED ORDER — DEXAMETHASONE 4 MG PO TABS: 4.0000 mg | ORAL_TABLET | Freq: Every day | ORAL | 0 refills | Status: DC

## 2019-10-27 ENCOUNTER — Other Ambulatory Visit: Payer: Self-pay | Admitting: *Deleted

## 2019-10-28 ENCOUNTER — Encounter: Payer: Self-pay | Admitting: Oncology

## 2019-10-28 ENCOUNTER — Other Ambulatory Visit: Payer: Self-pay | Admitting: *Deleted

## 2019-10-28 MED ORDER — OXYCODONE-ACETAMINOPHEN 10-325 MG PO TABS: 1.0000 | ORAL_TABLET | Freq: Four times a day (QID) | ORAL | 0 refills | Status: DC | PRN

## 2019-10-28 MED ORDER — MORPHINE SULFATE ER 30 MG PO TBCR: 30.0000 mg | EXTENDED_RELEASE_TABLET | Freq: Three times a day (TID) | ORAL | 0 refills | Status: DC

## 2019-10-28 NOTE — Telephone Encounter (Signed)
Patient called cancer center requesting refill of Percocet 10/325 mg every 6 hours as needed for pain and MS Contin 30 mg every 8 hours.  As mandated by the [place] STOP Act (Strengthen Opioid Misuse Prevention), the [place] Controlled Substance Reporting System ([place]) was reviewed for this patient. Below is the past 35-months of controlled substance prescriptions as displayed by the registry. I have personally consulted with my supervising physician, Dr. Grayland Ormond, who agrees that continuation of opiate therapy is medically appropriate at this time and agrees to provide continual monitoring, including urine/blood drug screens, as indicated. Refill is appropriate on or after 10/28/2019.  NCCSRS reviewed: Reviewed and appropriate.    Faythe Casa, NP 10/28/2019 12:24 PM [phone]

## 2019-10-29 ENCOUNTER — Ambulatory Visit
Admission: RE | Admit: 2019-10-29 | Discharge: 2019-10-29 | Disposition: A | Discharge status: Home / Self Care | Payer: Medicaid Other | Source: Ambulatory Visit | Attending: Radiation Oncology | Admitting: Radiation Oncology

## 2019-10-29 ENCOUNTER — Encounter: Payer: Self-pay | Admitting: Internal Medicine

## 2019-10-29 ENCOUNTER — Telehealth: Payer: Self-pay | Admitting: *Deleted

## 2019-10-29 ENCOUNTER — Other Ambulatory Visit: Payer: Self-pay

## 2019-10-29 NOTE — Telephone Encounter (Signed)
Contacted patient for prescreening phone call prior to her apt with Dr. Rogue Bussing. Patient's friend 'Velna Hatchet' asked to speak to me. Stated that they were at the pharmacy. The pharmacist would not dispense the prescription without a icd-10 code written on the narcotic scripts. She states that this has to be done every time in order for the script to be filled. I promptly called the pharmacist. I gave the pharmacist the icd-10 codes verbally. Pharmacist told me that it would take another 2-3 hours for this to process with patient's insurance approval.  Pharmacist requests that when naroctics are RF on pt-to type the icd-10 code on script to avoid RF delays.

## 2019-10-30 ENCOUNTER — Inpatient Hospital Stay (HOSPITAL_BASED_OUTPATIENT_CLINIC_OR_DEPARTMENT_OTHER): Payer: Self-pay | Admitting: Internal Medicine

## 2019-10-30 ENCOUNTER — Other Ambulatory Visit: Payer: Self-pay

## 2019-10-30 ENCOUNTER — Inpatient Hospital Stay: Payer: Self-pay

## 2019-10-30 ENCOUNTER — Inpatient Hospital Stay (HOSPITAL_BASED_OUTPATIENT_CLINIC_OR_DEPARTMENT_OTHER): Payer: Self-pay | Admitting: Hospice and Palliative Medicine

## 2019-10-30 DIAGNOSIS — Z7189 Other specified counseling: Secondary | ICD-10-CM

## 2019-10-30 DIAGNOSIS — C642 Malignant neoplasm of left kidney, except renal pelvis: Secondary | ICD-10-CM

## 2019-10-30 DIAGNOSIS — G893 Neoplasm related pain (acute) (chronic): Secondary | ICD-10-CM

## 2019-10-30 DIAGNOSIS — Z515 Encounter for palliative care: Secondary | ICD-10-CM

## 2019-10-30 DIAGNOSIS — Z95828 Presence of other vascular implants and grafts: Secondary | ICD-10-CM

## 2019-10-30 LAB — CBC WITH DIFFERENTIAL/PLATELET
Abs Immature Granulocytes: 0.09 10*3/uL — ABNORMAL HIGH (ref 0.00–0.07)
Basophils Absolute: 0 10*3/uL (ref 0.0–0.1)
Basophils Relative: 0 %
Eosinophils Absolute: 0 10*3/uL (ref 0.0–0.5)
Eosinophils Relative: 0 %
HCT: 40.7 % (ref 36.0–46.0)
Hemoglobin: 12.3 g/dL (ref 12.0–15.0)
Immature Granulocytes: 1 %
Lymphocytes Relative: 4 %
Lymphs Abs: 0.5 10*3/uL — ABNORMAL LOW (ref 0.7–4.0)
MCH: 27.8 pg (ref 26.0–34.0)
MCHC: 30.2 g/dL (ref 30.0–36.0)
MCV: 92.1 fL (ref 80.0–100.0)
Monocytes Absolute: 0.4 10*3/uL (ref 0.1–1.0)
Monocytes Relative: 4 %
Neutro Abs: 10.6 10*3/uL — ABNORMAL HIGH (ref 1.7–7.7)
Neutrophils Relative %: 91 %
Platelets: 225 10*3/uL (ref 150–400)
RBC: 4.42 MIL/uL (ref 3.87–5.11)
RDW: 17.5 % — ABNORMAL HIGH (ref 11.5–15.5)
WBC: 11.6 10*3/uL — ABNORMAL HIGH (ref 4.0–10.5)
nRBC: 0 % (ref 0.0–0.2)

## 2019-10-30 LAB — COMPREHENSIVE METABOLIC PANEL
ALT: 15 U/L (ref 0–44)
AST: 20 U/L (ref 15–41)
Albumin: 3.1 g/dL — ABNORMAL LOW (ref 3.5–5.0)
Alkaline Phosphatase: 82 U/L (ref 38–126)
Anion gap: 9 (ref 5–15)
BUN: 26 mg/dL — ABNORMAL HIGH (ref 6–20)
CO2: 24 mmol/L (ref 22–32)
Calcium: 8.3 mg/dL — ABNORMAL LOW (ref 8.9–10.3)
Chloride: 102 mmol/L (ref 98–111)
Creatinine, Ser: 0.58 mg/dL (ref 0.44–1.00)
GFR calc Af Amer: >60 mL/min (ref >60)
GFR calc non Af Amer: >60 mL/min (ref >60)
Glucose, Bld: 151 mg/dL — ABNORMAL HIGH (ref 70–99)
Potassium: 4.3 mmol/L (ref 3.5–5.1)
Sodium: 135 mmol/L (ref 135–145)
Total Bilirubin: 0.4 mg/dL (ref 0.3–1.2)
Total Protein: 6.6 g/dL (ref 6.5–8.1)

## 2019-10-30 LAB — LACTATE DEHYDROGENASE: LDH: 170 U/L (ref 98–192)

## 2019-10-30 MED ORDER — SODIUM CHLORIDE 0.9% FLUSH
10.0000 mL | INTRAVENOUS | Status: DC | PRN
  Filled 2019-10-30: qty 10

## 2019-10-30 MED ORDER — SODIUM CHLORIDE 0.9% FLUSH
10.0000 mL | Freq: Once | INTRAVENOUS | Status: AC
  Administered 2019-10-30: 10 mL via INTRAVENOUS
  Filled 2019-10-30: qty 10

## 2019-10-30 MED ORDER — SODIUM CHLORIDE 0.9 % IV SOLN
Freq: Once | INTRAVENOUS | Status: AC
  Filled 2019-10-30: qty 250

## 2019-10-30 MED ORDER — HEPARIN SOD (PORK) LOCK FLUSH 100 UNIT/ML IV SOLN
500.0000 [IU] | Freq: Once | INTRAVENOUS | Status: AC | PRN
  Administered 2019-10-30: 500 [IU]
  Filled 2019-10-30: qty 5

## 2019-10-30 MED ORDER — HEPARIN SOD (PORK) LOCK FLUSH 100 UNIT/ML IV SOLN
INTRAVENOUS | Status: AC
  Filled 2019-10-30: qty 5

## 2019-10-30 MED ORDER — SODIUM CHLORIDE 0.9 % IV SOLN
200.0000 mg | Freq: Once | INTRAVENOUS | Status: AC
  Administered 2019-10-30: 200 mg via INTRAVENOUS
  Filled 2019-10-30: qty 8

## 2019-10-30 NOTE — Assessment & Plan Note (Addendum)
#   Metastatic renal cell carcinoma-stage IV [lung nodules chest wall mass]; poor risk.  Currently on Keytruda plus axitinib s/p cycle #1.   # proceed with [place] #2 today. Labs today reviewed;  acceptable for treatment today.   # Brain MRI-solitary brain metastasis/hemorrhagic with edema-likely secondary to [place] s/p SBRT [2/11]; cut dose of dex to 2mg /day [RT- refilled 2/12]; awaiting RT to left hip.   # Left chest wall pain-likely malignancy-STABLE;  On MS Contin to 30 mg 3 times a day; Percocet 10/325.  # Hypocalcemia [hypercalcemia at presentation]- s/p Zometa; calcium 8.2 hold Zometa today.  # weight loss- s/p Joli- monitor weight loss closely.   # I spoke at length with the patient's friend, Velna Hatchet-  regarding the patient's clinical status/plan of care.  # DISPOSITION: # Keytruda today # follow up-3 weeks MD; labs- cbc/cmp; Keytruda-Dr.B

## 2019-10-30 NOTE — Progress Notes (Signed)
[place] CONSULT NOTE  Patient Care Team: Patient, No Pcp Per as PCP - General (General Practice)  CHIEF COMPLAINTS/PURPOSE OF CONSULTATION: Lung mass/history of kidney cancer#   Oncology History Overview Note  # DEC 2020- left chest wall mass; bil mediatisnal LN; bil Lung nodules.   #  [facility] ]Renal cell carcinoma, clear cell type, [place]hman grade 2 measuring 6.4. It extended into major veins. Negative margins. 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 [CT chest wall Bx]; Poor risk [anemia;PS;hypercalcemia]; Start RT [10/09/2019]; CT abdomen pelvis-multiple lung nodules; right pelvic lesion [RT-Feb2021]  #Jan 26th 2021-solitary ~1.5 cm lesion left frontal lobe/hemorrhagic/edema brain met s/p SBRT [finished Feb 11th,2021];   # JAN 22nd, 2021-Key+Axi  # 2002- cervical cancer LEEP/conization; no chemo-RT  # dec 2020- Hypercalcemia- Ca- 12; Zometa  # NGS/MOLECULAR TESTS: pending.   # PALLIATIVE CARE EVALUATION: Josh  # PAIN MANAGEMENT: P  DIAGNOSIS: RCC  STAGE:   IV    ;  GOALS: palliative  CURRENT/MOST RECENT THERAPY : key+Axi [C]    Cancer of left kidney ([place])  09/17/2019 Initial Diagnosis   Cancer of left kidney ([place])   10/09/2019 -  Chemotherapy   The patient had pembrolizumab (KEYTRUDA) 200 mg in sodium chloride 0.9 % 50 mL chemo infusion, 200 mg, Intravenous, Once, 2 of 6 cycles Administration: 200 mg (10/09/2019), 200 mg (10/30/2019)  for chemotherapy treatment.       HISTORY OF PRESENTING ILLNESS:  Angelica Black 49 y.o.  female stage IV carcinoma of the kidney is here for follow-up/proceed with Keytruda.  Patient is currently s/p left chest wall radiation; also s/p SBRT to the brain lesion.  Finished yesterday.   She is currently awaiting starting of radiation to the hip.  Patient complains of continued pain chest wall/hip.  She continues to MS Contin 3 times a day; also on  Percocet.   Denies any headaches.  Denies any nausea vomiting; no diarrhea.  No rash.  Appetite is improving.  However continues to lose weight.  Review of Systems  Constitutional: Positive for weight loss. Negative for chills, diaphoresis, fever and malaise/fatigue.  HENT: Negative for nosebleeds and sore throat.   Eyes: Negative for double vision.  Respiratory: Positive for cough and shortness of breath. Negative for hemoptysis, sputum production and wheezing.   Cardiovascular: Positive for chest pain. Negative for palpitations, orthopnea and leg swelling.  Gastrointestinal: Negative for abdominal pain, blood in stool, constipation, diarrhea, heartburn, melena, nausea and vomiting.  Genitourinary: Negative for dysuria, frequency and urgency.  Musculoskeletal: Positive for back pain and joint pain.  Skin: Negative.  Negative for itching and rash.  Neurological: Positive for tingling. Negative for dizziness, focal weakness, weakness and headaches.  Endo/Heme/Allergies: Does not bruise/bleed easily.  Psychiatric/Behavioral: Negative for depression. The patient is not nervous/anxious and does not have insomnia.      MEDICAL HISTORY:  Past Medical History:  Diagnosis Date  . Cervical cancer ([place])   . Renal cell carcinoma ([place]) 2016   Pathologic stage from 07/08/2015: Stage Unknown (T3a, NX, cM0) - Signed by Darin Engels, NP on 11/25/2015    SURGICAL HISTORY: Past Surgical History:  Procedure Laterality Date  . CERVICAL BIOPSY  W/ LOOP ELECTRODE EXCISION    . IR IMAGING GUIDED PORT INSERTION  10/05/2019  . PARTIAL NEPHRECTOMY Left 07/08/2015   robotic laparoscopic nephrectomy at [place]: Social History   Socioeconomic History  .  Marital status: Divorced    Spouse name: Not on file  . Number of children: Not on file  . Years of education: Not on file  . Highest education level: Not on file  Occupational History  . Not on file  Tobacco Use  . Smoking  status: Current Every Day Smoker    Packs/day: 1.00    Years: 35.00    Pack years: 35.00    Types: Cigarettes  . Smokeless tobacco: Never Used  Substance and Sexual Activity  . Alcohol use: Yes    Comment: ocassional   . Drug use: Not on file  . Sexual activity: Not on file  Other Topics Concern  . 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 Difficulty of Paying Living Expenses: Not on file  Food Insecurity:   . Worried About Charity fundraiser in the Last Year: Not on file  . Ran Out of Food in the Last Year: Not on file  Transportation Needs:   . Lack of Transportation (Medical): Not on file  . Lack of Transportation (Non-Medical): Not on file  Physical Activity:   . Days of Exercise per Week: Not on file  . Minutes of Exercise per Session: Not on file  Stress:   . Feeling of Stress : Not on file  Social Connections:   . Frequency of Communication with Friends and Family: Not on file  . Frequency of Social Gatherings with Friends and Family: Not on file  . Attends Religious Services: Not on file  . Active Member of Clubs or Organizations: Not on file  . Attends Archivist Meetings: Not on file  . Marital Status: Not on file  Intimate Partner Violence:   . Fear of Current or Ex-Partner: Not on file  . Emotionally Abused: Not on file  . Physically Abused: Not on file  . Sexually Abused: Not on file    FAMILY HISTORY: Family History  Problem Relation Age of Onset  . Hypertension Father     ALLERGIES:  has No Known Allergies.  MEDICATIONS:  Current Outpatient Medications  Medication Sig Dispense Refill  . Aspirin-Salicylamide-Caffeine (BC HEADACHE POWDER PO) Take 1 packet by mouth 3 (three) times daily.    Marland Kitchen axitinib (INLYTA) 5 MG tablet Take 1  tablet (5 mg total) by mouth every 12 (twelve) hours. 60 tablet 4  . citalopram (CELEXA) 20 MG tablet TAKE 1 TABLET BY MOUTH EVERY DAY 90 tablet 2  . dexamethasone (DECADRON) 4 MG tablet Take one pill in AM with breakfast 14 tablet 0  . dexamethasone (DECADRON) 4 MG tablet Take 1 tablet (4 mg total) by mouth daily. Follow given instructions 10 tablet 0  . diazepam (VALIUM) 5 MG tablet One pill 45-60 mins prior to procedure; 1 pill 15 mins prior if needed. 4 tablet 0  . lidocaine-prilocaine (EMLA) cream Apply 1 application topically as needed. 30 g 3  . morphine (MS CONTIN) 30 MG 12 hr tablet Take 1 tablet (30 mg total) by mouth every 8 (eight) hours. 30 tablet 0  . ondansetron (ZOFRAN) 8 MG tablet One pill every 8 hours as needed for nausea/vomitting. 40 tablet 1  . oxyCODONE-acetaminophen (PERCOCET) 10-325 MG tablet Take 1 tablet by mouth every 6 (six) hours as needed for  pain. 60 tablet 0  . prochlorperazine (COMPAZINE) 10 MG tablet Take 1 tablet (10 mg total) by mouth every 6 (six) hours as needed for nausea or vomiting. 40 tablet 1  . traZODone (DESYREL) 50 MG tablet Take 1 tablet (50 mg total) by mouth at bedtime as needed for sleep. 30 tablet 2  . chlorpheniramine-HYDROcodone (TUSSIONEX) 10-8 MG/5ML SUER Take 5 mLs by mouth at bedtime as needed for cough. (Patient not taking: Reported on 10/14/2019) 140 mL 0   No current facility-administered medications for this visit.      Marland Kitchen  PHYSICAL EXAMINATION: ECOG PERFORMANCE STATUS: 1 - Symptomatic but completely ambulatory  Vitals:   10/30/19 1330  BP: 118/75  Pulse: 75  Resp: 18  Temp: (!) 97.4 F (36.3 C)   Filed Weights   10/30/19 1330  Weight: 132 lb (59.9 kg)    Physical Exam  Constitutional: She is oriented to person, place, and time and well-developed, well-nourished, and in no distress.  Thin built female patient.  She is accompanied by her friend.  She is in a wheelchair.  HENT:  Head: Normocephalic and atraumatic.   Mouth/Throat: Oropharynx is clear and moist. No oropharyngeal exudate.  Eyes: Pupils are equal, round, and reactive to light.  Cardiovascular: Normal rate and regular rhythm.  Pulmonary/Chest: No respiratory distress. She has no wheezes.  Decreased air entry bilaterally.  Abdominal: Soft. Bowel sounds are normal. She exhibits no distension and no mass. There is no abdominal tenderness. There is no rebound and no guarding.  Musculoskeletal:        General: No tenderness or edema. Normal range of motion.     Cervical back: Normal range of motion and neck supple.  Neurological: She is alert and oriented to person, place, and time.  Skin: Skin is warm.  Vague mass noted in the left chest wall region.  Tender to touch.  Psychiatric: Affect normal.  Anxious.    LABORATORY DATA:  I have reviewed the data as listed Lab Results  Component Value Date   WBC 11.6 (H) 10/30/2019   HGB 12.3 10/30/2019   HCT 40.7 10/30/2019   MCV 92.1 10/30/2019   PLT 225 10/30/2019   Recent Labs    09/30/19 0837 10/09/19 1250 10/30/19 1324  NA 135 133* 135  K 4.1 4.0 4.3  CL 104 100 102  CO2 21* 22 24  GLUCOSE 97 108* 151*  BUN 14 11 26*  CREATININE 0.78 0.70 0.58  CALCIUM 8.6* 8.9 8.3*  GFRNONAA >60 >60 >60  GFRAA >60 >60 >60  PROT  --  6.6 6.6  ALBUMIN  --  2.3* 3.1*  AST  --  18 20  ALT  --  8 15  ALKPHOS  --  88 82  BILITOT  --  0.5 0.4    RADIOGRAPHIC STUDIES: I have personally reviewed the radiological images as listed and agreed with the findings in the report. MR Brain W Wo Contrast  Result Date: 10/22/2019 CLINICAL DATA:  History of renal cell carcinoma with left frontal brain mass. EXAM: MRI HEAD WITHOUT AND WITH CONTRAST TECHNIQUE: Multiplanar, multiecho pulse sequences of the brain and surrounding structures were obtained without and with intravenous contrast. CONTRAST:  98m GADAVIST GADOBUTROL 1 MMOL/ML IV SOLN COMPARISON:  10/13/2019 FINDINGS: Brain: A 1.4 x 1.2 cm  heterogeneously enhancing, hemorrhagic mass in the left frontal lobe appears minimally smaller than on the prior study, and moderate surrounding edema has decreased. No second enhancing lesion is identified. There is no  acute infarct, midline shift, or extra-axial fluid collection. The ventricles and sulci are normal. Vascular: Major intracranial vascular flow voids are preserved. Skull and upper cervical spine: Unchanged subcentimeter enhancing foci involving the left frontoparietal and right parietal skull (series 16, images 146 and 127). Sinuses/Orbits: Unremarkable orbits. Trace bilateral mastoid fluid. Clear paranasal sinuses. Other: None. IMPRESSION: 1. Left frontal hemorrhagic mass consistent with a metastasis with decreased edema. 2. No new intracranial metastases identified. 3. Two unchanged nonspecific subcentimeter enhancing skull lesions. Electronically Signed   By: Logan Bores M.D.   On: 10/22/2019 16:00   MR Brain W Wo Contrast  Result Date: 10/13/2019 CLINICAL DATA:  Renal cancer EXAM: MRI HEAD WITHOUT AND WITH CONTRAST TECHNIQUE: Multiplanar, multiecho pulse sequences of the brain and surrounding structures were obtained without and with intravenous contrast. CONTRAST:  59m GADAVIST GADOBUTROL 1 MMOL/ML IV SOLN COMPARISON:  None. FINDINGS: Brain: There is a single enhancing lesion in the anterior left frontal lobe white matter measuring approximately 1.5 x 1.4 x 1.1 cm. There is associated susceptibility reflecting intralesional blood products. Moderate surrounding edema with mild regional mass effect. No midline shift or herniation. There is no acute infarction. There is no hydrocephalus or extra-axial fluid collection. No abnormal leptomeningeal enhancement. Vascular: Major vessel flow voids at the skull base are preserved. Skull and upper cervical spine: Nonspecific subcentimeter foci of enhancement posterior to the coronal suture on the left (series 18, image 156) and posterior right  parietal calvarium (image 149). Sinuses/Orbits: Minor mucosal thickening.  Orbits are unremarkable. Other: Sella is unremarkable. Minimal left mastoid fluid opacification. IMPRESSION: Single anterior left frontal lobe parenchymal metastasis with intralesional hemorrhage. Moderate surrounding edema without substantial mass effect. Two nonspecific foci of calvarial enhancement. Electronically Signed   By: PMacy MisM.D.   On: 10/13/2019 16:57   CT ABDOMEN PELVIS W CONTRAST  Result Date: 10/13/2019 CLINICAL DATA:  Renal cell carcinoma metastasis. LEFT nephrectomy. Chemotherapy ongoing EXAM: CT ABDOMEN AND PELVIS WITH CONTRAST TECHNIQUE: Multidetector CT imaging of the abdomen and pelvis was performed using the standard protocol following bolus administration of intravenous contrast. CONTRAST:  1030mOMNIPAQUE IOHEXOL 300 MG/ML  SOLN COMPARISON:  Chest CT 09/16/2019 FINDINGS: Lower chest: Innumerable pulmonary nodules at the lung bases. Extensive pleural thickening at the LEFT lung base measuring up to 1.9 cm (image 4/2). Pleural thickening involves the cardiac border. Hepatobiliary: Mild intrahepatic and extrahepatic biliary duct dilatation. No obstructing lesion identified. Gallbladder contains a gallstone. No gallbladder distension Pancreas: Pancreas is normal. No ductal dilatation. No pancreatic inflammation. Spleen: Normal spleen Adrenals/urinary tract: Post LEFT adrenalectomy and nephrectomy. There is nodular thickening in the LEFT suprarenal at the splenic hilum which is favored the tail the pancreas. (Image 14/2). RIGHT kidney is normal. RIGHT ureter and bladder normal. Stomach/Bowel: Stomach, small bowel, appendix, and cecum are normal. The colon and rectosigmoid colon are normal. Vascular/Lymphatic: Abdominal aorta is normal caliber with atherosclerotic calcification. There is no retroperitoneal or periportal lymphadenopathy. No pelvic lymphadenopathy. Reproductive: Uterus and ovaries normal Other: No  free fluid Musculoskeletal: Aggressive lesion in the RIGHT iliac wing number ups through the inter outer cortex and measures 4 cm in width (image 50/2). Lytic lesion at L1 measures 1.3 cm (image 77/2 ) IMPRESSION: 1. Widespread bilateral nodular pulmonary metastasis at the lung bases. 2. Extensive pleural metastasis involving the LEFT lung base pleural surface. 3. Post LEFT nephrectomy. No evidence of local recurrence. Nodularity in the splenic hilum is favored the tail of the pancreas. 4. 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 Lytic lesion at L1. 5. Mild intrahepatic and extrahepatic biliary duct dilatation without obstructing lesion. Gallstone without evidence of cholecystitis. 6. Normal RIGHT kidney. Electronically Signed   By: Suzy Bouchard M.D.   On: 10/13/2019 17:05   IR IMAGING GUIDED PORT INSERTION  Result Date: 10/05/2019 INDICATION: 49 year old female referred for port placement EXAM: IMPLANTED PORT A CATH PLACEMENT WITH ULTRASOUND AND FLUOROSCOPIC GUIDANCE MEDICATIONS: 2 G ANCEF; The antibiotic was administered within an appropriate time interval prior to skin puncture. ANESTHESIA/SEDATION: Moderate (conscious) sedation was employed during this procedure. A total of Versed 3.0 mg and Fentanyl 100 mcg was administered intravenously. Moderate Sedation Time: 17 minutes. The patient's level of consciousness and vital signs were monitored continuously by radiology nursing throughout the procedure under my direct supervision. FLUOROSCOPY TIME:  0 minutes, 18 seconds (5.3 mGy) COMPLICATIONS: NONE PROCEDURE: The procedure, risks, benefits, and alternatives were explained to the patient. Questions regarding the procedure were encouraged and answered. The patient understands and consents to the procedure. Ultrasound survey was performed with images stored and sent to PACs. The right neck and chest was prepped with chlorhexidine, and draped in the usual sterile fashion using maximum  barrier technique (cap and mask, sterile gown, sterile gloves, large sterile sheet, hand hygiene and cutaneous antiseptic). Antibiotic prophylaxis was provided with 2.0g Ancef administered IV one hour prior to skin incision. Local anesthesia was attained by infiltration with 1% lidocaine without epinephrine. Ultrasound demonstrated patency of the right internal jugular vein, and this was documented with an image. Under real-time ultrasound guidance, this vein was accessed with a 21 gauge micropuncture needle and image documentation was performed. A small dermatotomy was made at the access site with an 11 scalpel. A 0.018" wire was advanced into the SVC and used to estimate the length of the internal catheter. The access needle exchanged for a 68F micropuncture vascular sheath. The 0.018" wire was then removed and a 0.035" wire advanced into the IVC. An appropriate location for the subcutaneous reservoir was selected below the clavicle and an incision was made through the skin and underlying soft tissues. The subcutaneous tissues were then dissected using a combination of blunt and sharp surgical technique and a pocket was formed. A single lumen power injectable portacatheter was then tunneled through the subcutaneous tissues from the pocket to the dermatotomy and the port reservoir placed within the subcutaneous pocket. The venous access site was then serially dilated and a peel away vascular sheath placed over the wire. The wire was removed and the port catheter advanced into position under fluoroscopic guidance. The catheter tip is positioned in the cavoatrial junction. This was documented with a spot image. The portacatheter was then tested and found to flush and aspirate well. The port was flushed with saline followed by 100 units/mL heparinized saline. The pocket was then closed in two layers using first subdermal inverted interrupted absorbable sutures followed by a running subcuticular suture. The epidermis  was then sealed with Dermabond. The dermatotomy at the venous access site was also seal with Dermabond. Patient tolerated the procedure well and remained hemodynamically stable throughout. No complications encountered and no significant blood loss encountered IMPRESSION: Status post right IJ port catheter placement. Signed, Dulcy Fanny. Dellia Nims, RPVI [facility] Radiology Electronically Signed   By: Corrie Mckusick D.O.   On: 10/05/2019 16:43    ASSESSMENT & PLAN:   Cancer of left kidney ([place]) # Metastatic renal cell carcinoma-stage IV [lung nodules  chest wall mass]; poor risk.  Currently on Keytruda plus axitinib s/p cycle #1.   # proceed with [place] #2 today. Labs today reviewed;  acceptable for treatment today.   # Brain MRI-solitary brain metastasis/hemorrhagic with edema-likely secondary to [place] s/p SBRT [2/11]; cut dose of dex to 69m/day [RT- refilled 2/12]; awaiting RT to left hip.   # Left chest wall pain-likely malignancy-STABLE;  On MS Contin to 30 mg 3 times a day; Percocet 10/325.  # Hypocalcemia [hypercalcemia at presentation]- s/p Zometa; calcium 8.2 hold Zometa today.  # weight loss- s/p Joli- monitor weight loss closely.   # I spoke at length with the patient's friend, BVelna Hatchet  regarding the patient's clinical status/plan of care.  # DISPOSITION: # Keytruda today # follow up-3 weeks MD; labs- cbc/cmp; Keytruda-Dr.B     All questions were answered. The patient knows to call the clinic with any problems, questions or concerns.    GCammie Sickle MD 11/02/2019 8:00 AM

## 2019-10-30 NOTE — Progress Notes (Signed)
[place]  [phone] [phone]   Name: Angelica Black Date: 10/30/2019 MRN: [id number]  DOB: [birth year]  Patient Care Team: Patient, No Pcp Per as PCP - General (General Practice)    REASON FOR CONSULTATION: Angelica Black is a 49 y.o. 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  Patient has had chest wall pain, poor oral intake, and weakness.  She is referred to palliative care to help address goals and manage ongoing symptoms.   SOCIAL HISTORY:     reports that she has been smoking cigarettes. She has a 35.00 pack-year smoking history. She has never used smokeless tobacco. She reports current alcohol use.   Patient has a friend [name] who is involved in her care  ADVANCE DIRECTIVES:    CODE STATUS:   PAST MEDICAL HISTORY: Past Medical History:  Diagnosis Date  . Cervical cancer ([place])   . Renal cell carcinoma ([place]) 2016   Pathologic stage from 07/08/2015: Stage Unknown (T3a, NX, cM0) - Signed by Darin Engels, NP on 11/25/2015    PAST SURGICAL HISTORY:  Past Surgical History:  Procedure Laterality Date  . CERVICAL BIOPSY  W/ LOOP ELECTRODE EXCISION    . IR IMAGING GUIDED PORT INSERTION  10/05/2019  . PARTIAL NEPHRECTOMY Left 07/08/2015   robotic laparoscopic nephrectomy at [facility]    HEMATOLOGY/ONCOLOGY HISTORY:  Oncology History Overview Note  # DEC 2020- left chest wall mass; bil mediatisnal LN; bil Lung nodules.   #  2016[facility] ]Renal cell carcinoma, clear cell type, [place]hman grade 2 measuring 6.4. It extended into major veins. Negative margins. 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 [CT chest wall Bx]; Poor risk [anemia;PS;hypercalcemia]; Start RT [10/09/2019]; CT abdomen pelvis-multiple lung nodules; right pelvic lesion.   #Jan 26th  2021-solitary ~1.5 cm lesion left frontal lobe/hemorrhagic/edema brain met  # JAN 22nd, 2021-Key+Axi  # 2002- cervical cancer LEEP/conization; no chemo-RT  # dec 2020- Hypercalcemia- Ca- 12; Zometa  # NGS/MOLECULAR TESTS: pending.   # PALLIATIVE CARE EVALUATION: Josh  # PAIN MANAGEMENT: P  DIAGNOSIS: RCC  STAGE:   IV    ;  GOALS: palliative  CURRENT/MOST RECENT THERAPY : key+Axi [C]    Cancer of left kidney ([place])  09/17/2019 Initial Diagnosis   Cancer of left kidney ([place])   10/09/2019 -  Chemotherapy   The patient had pembrolizumab (KEYTRUDA) 200 mg in sodium chloride 0.9 % 50 mL chemo infusion, 200 mg, Intravenous, Once, 2 of 6 cycles Administration: 200 mg (10/09/2019)  for chemotherapy treatment.      ALLERGIES:  has No Known Allergies.  MEDICATIONS:  Current Outpatient Medications  Medication Sig Dispense Refill  . Aspirin-Salicylamide-Caffeine (BC HEADACHE POWDER PO) Take 1 packet by mouth 3 (three) times daily.    Marland Kitchen axitinib (INLYTA) 5 MG tablet Take 1 tablet (5 mg total) by mouth every 12 (twelve) hours. 60 tablet 4  . chlorpheniramine-HYDROcodone (TUSSIONEX) 10-8 MG/5ML SUER Take 5 mLs by mouth at bedtime as needed for cough. (Patient not taking: Reported on 10/14/2019) 140 mL 0  . citalopram (CELEXA) 20 MG tablet TAKE 1 TABLET BY MOUTH EVERY DAY 90 tablet 2  . dexamethasone (DECADRON) 4 MG tablet Take one pill in AM with breakfast 14 tablet 0  . dexamethasone (DECADRON) 4 MG tablet Take 1 tablet (4 mg total) by mouth  daily. Follow given instructions 10 tablet 0  . diazepam (VALIUM) 5 MG tablet One pill 45-60 mins prior to procedure; 1 pill 15 mins prior if needed. 4 tablet 0  . lidocaine-prilocaine (EMLA) cream Apply 1 application topically as needed. 30 g 3  . morphine (MS CONTIN) 30 MG 12 hr tablet Take 1 tablet (30 mg total) by mouth every 8 (eight) hours. 30 tablet 0  . ondansetron (ZOFRAN) 8 MG tablet One pill every 8 hours as needed for nausea/vomitting. 40  tablet 1  . oxyCODONE-acetaminophen (PERCOCET) 10-325 MG tablet Take 1 tablet by mouth every 6 (six) hours as needed for pain. 60 tablet 0  . prochlorperazine (COMPAZINE) 10 MG tablet Take 1 tablet (10 mg total) by mouth every 6 (six) hours as needed for nausea or vomiting. 40 tablet 1  . traZODone (DESYREL) 50 MG tablet Take 1 tablet (50 mg total) by mouth at bedtime as needed for sleep. 30 tablet 2   No current facility-administered medications for this visit.   Facility-Administered Medications Ordered in Other Visits  Medication Dose Route Frequency Provider Last Rate Last Admin  . heparin lock flush 100 unit/mL  500 Units Intracatheter Once PRN Cammie Sickle, MD      . pembrolizumab [facility]) 200 mg in sodium chloride 0.9 % 50 mL chemo infusion  200 mg Intravenous Once Cammie Sickle, MD 116 mL/hr at 10/30/19 1458 200 mg at 10/30/19 1458  . sodium chloride flush (NS) 0.9 % injection 10 mL  10 mL Intracatheter PRN Cammie Sickle, MD        VITAL SIGNS: LMP  (LMP Unknown)  There were no vitals filed for this visit.  Estimated body mass index is 20.07 kg/m as calculated from the following:   Height as of an earlier encounter on 10/30/19: 5' 8"  (1.727 m).   Weight as of an earlier encounter on 10/30/19: 132 lb (59.9 kg).  LABS: CBC:    Component Value Date/Time   WBC 11.6 (H) 10/30/2019 1324   HGB 12.3 10/30/2019 1324   HCT 40.7 10/30/2019 1324   PLT 225 10/30/2019 1324   MCV 92.1 10/30/2019 1324   NEUTROABS 10.6 (H) 10/30/2019 1324   LYMPHSABS 0.5 (L) 10/30/2019 1324   MONOABS 0.4 10/30/2019 1324   EOSABS 0.0 10/30/2019 1324   BASOSABS 0.0 10/30/2019 1324   Comprehensive Metabolic Panel:    Component Value Date/Time   NA 135 10/30/2019 1324   K 4.3 10/30/2019 1324   CL 102 10/30/2019 1324   CO2 24 10/30/2019 1324   BUN 26 (H) 10/30/2019 1324   CREATININE 0.58 10/30/2019 1324   GLUCOSE 151 (H) 10/30/2019 1324   CALCIUM 8.3 (L) 10/30/2019 1324    AST 20 10/30/2019 1324   ALT 15 10/30/2019 1324   ALKPHOS 82 10/30/2019 1324   BILITOT 0.4 10/30/2019 1324   PROT 6.6 10/30/2019 1324   ALBUMIN 3.1 (L) 10/30/2019 1324    RADIOGRAPHIC STUDIES: MR Brain W Wo Contrast  Result Date: 10/22/2019 CLINICAL DATA:  History of renal cell carcinoma with left frontal brain mass. EXAM: MRI HEAD WITHOUT AND WITH CONTRAST TECHNIQUE: Multiplanar, multiecho pulse sequences of the brain and surrounding structures were obtained without and with intravenous contrast. CONTRAST:  42m GADAVIST GADOBUTROL 1 MMOL/ML IV SOLN COMPARISON:  10/13/2019 FINDINGS: Brain: A 1.4 x 1.2 cm heterogeneously enhancing, hemorrhagic mass in the left frontal lobe appears minimally smaller than on the prior study, and moderate surrounding edema has decreased. No second enhancing lesion  is identified. There is no acute infarct, midline shift, or extra-axial fluid collection. The ventricles and sulci are normal. Vascular: Major intracranial vascular flow voids are preserved. Skull and upper cervical spine: Unchanged subcentimeter enhancing foci involving the left frontoparietal and right parietal skull (series 16, images 146 and 127). Sinuses/Orbits: Unremarkable orbits. Trace bilateral mastoid fluid. Clear paranasal sinuses. Other: None. IMPRESSION: 1. Left frontal hemorrhagic mass consistent with a metastasis with decreased edema. 2. No new intracranial metastases identified. 3. Two unchanged nonspecific subcentimeter enhancing skull lesions. Electronically Signed   By: Logan Bores M.D.   On: 10/22/2019 16:00   MR Brain W Wo Contrast  Result Date: 10/13/2019 CLINICAL DATA:  Renal cancer EXAM: MRI HEAD WITHOUT AND WITH CONTRAST TECHNIQUE: Multiplanar, multiecho pulse sequences of the brain and surrounding structures were obtained without and with intravenous contrast. CONTRAST:  31m GADAVIST GADOBUTROL 1 MMOL/ML IV SOLN COMPARISON:  None. FINDINGS: Brain: There is a single enhancing lesion in  the anterior left frontal lobe white matter measuring approximately 1.5 x 1.4 x 1.1 cm. There is associated susceptibility reflecting intralesional blood products. Moderate surrounding edema with mild regional mass effect. No midline shift or herniation. There is no acute infarction. There is no hydrocephalus or extra-axial fluid collection. No abnormal leptomeningeal enhancement. Vascular: Major vessel flow voids at the skull base are preserved. Skull and upper cervical spine: Nonspecific subcentimeter foci of enhancement posterior to the coronal suture on the left (series 18, image 156) and posterior right parietal calvarium (image 149). Sinuses/Orbits: Minor mucosal thickening.  Orbits are unremarkable. Other: Sella is unremarkable. Minimal left mastoid fluid opacification. IMPRESSION: Single anterior left frontal lobe parenchymal metastasis with intralesional hemorrhage. Moderate surrounding edema without substantial mass effect. Two nonspecific foci of calvarial enhancement. Electronically Signed   By: PMacy MisM.D.   On: 10/13/2019 16:57   CT ABDOMEN PELVIS W CONTRAST  Result Date: 10/13/2019 CLINICAL DATA:  Renal cell carcinoma metastasis. LEFT nephrectomy. Chemotherapy ongoing EXAM: CT ABDOMEN AND PELVIS WITH CONTRAST TECHNIQUE: Multidetector CT imaging of the abdomen and pelvis was performed using the standard protocol following bolus administration of intravenous contrast. CONTRAST:  1055mOMNIPAQUE IOHEXOL 300 MG/ML  SOLN COMPARISON:  Chest CT 09/16/2019 FINDINGS: Lower chest: Innumerable pulmonary nodules at the lung bases. Extensive pleural thickening at the LEFT lung base measuring up to 1.9 cm (image 4/2). Pleural thickening involves the cardiac border. Hepatobiliary: Mild intrahepatic and extrahepatic biliary duct dilatation. No obstructing lesion identified. Gallbladder contains a gallstone. No gallbladder distension Pancreas: Pancreas is normal. No ductal dilatation. No pancreatic  inflammation. Spleen: Normal spleen Adrenals/urinary tract: Post LEFT adrenalectomy and nephrectomy. There is nodular thickening in the LEFT suprarenal at the splenic hilum which is favored the tail the pancreas. (Image 14/2). RIGHT kidney is normal. RIGHT ureter and bladder normal. Stomach/Bowel: Stomach, small bowel, appendix, and cecum are normal. The colon and rectosigmoid colon are normal. Vascular/Lymphatic: Abdominal aorta is normal caliber with atherosclerotic calcification. There is no retroperitoneal or periportal lymphadenopathy. No pelvic lymphadenopathy. Reproductive: Uterus and ovaries normal Other: No free fluid Musculoskeletal: Aggressive lesion in the RIGHT iliac wing number ups through the inter outer cortex and measures 4 cm in width (image 50/2). Lytic lesion at L1 measures 1.3 cm (image 77/2 ) IMPRESSION: 1. Widespread bilateral nodular pulmonary metastasis at the lung bases. 2. Extensive pleural metastasis involving the LEFT lung base pleural surface. 3. Post LEFT nephrectomy. No evidence of local recurrence. Nodularity in the splenic hilum is favored the tail of the pancreas. 4.  Bone metastasis with a soft tissue in the RIGHT iliac crest. Lytic lesion at L1. 5. Mild intrahepatic and extrahepatic biliary duct dilatation without obstructing lesion. Gallstone without evidence of cholecystitis. 6. Normal RIGHT kidney. Electronically Signed   By: Suzy Bouchard M.D.   On: 10/13/2019 17:05   IR IMAGING GUIDED PORT INSERTION  Result Date: 10/05/2019 INDICATION: 49 year old female referred for port placement EXAM: IMPLANTED PORT A CATH PLACEMENT WITH ULTRASOUND AND FLUOROSCOPIC GUIDANCE MEDICATIONS: 2 G ANCEF; The antibiotic was administered within an appropriate time interval prior to skin puncture. ANESTHESIA/SEDATION: Moderate (conscious) sedation was employed during this procedure. A total of Versed 3.0 mg and Fentanyl 100 mcg was administered intravenously. Moderate Sedation Time: 17  minutes. The patient's level of consciousness and vital signs were monitored continuously by radiology nursing throughout the procedure under my direct supervision. FLUOROSCOPY TIME:  0 minutes, 18 seconds (5.3 mGy) COMPLICATIONS: NONE PROCEDURE: The procedure, risks, benefits, and alternatives were explained to the patient. Questions regarding the procedure were encouraged and answered. The patient understands and consents to the procedure. Ultrasound survey was performed with images stored and sent to PACs. The right neck and chest was prepped with chlorhexidine, and draped in the usual sterile fashion using maximum barrier technique (cap and mask, sterile gown, sterile gloves, large sterile sheet, hand hygiene and cutaneous antiseptic). Antibiotic prophylaxis was provided with 2.0g Ancef administered IV one hour prior to skin incision. Local anesthesia was attained by infiltration with 1% lidocaine without epinephrine. Ultrasound demonstrated patency of the right internal jugular vein, and this was documented with an image. Under real-time ultrasound guidance, this vein was accessed with a 21 gauge micropuncture needle and image documentation was performed. A small dermatotomy was made at the access site with an 11 scalpel. A 0.018" wire was advanced into the SVC and used to estimate the length of the internal catheter. The access needle exchanged for a 86F micropuncture vascular sheath. The 0.018" wire was then removed and a 0.035" wire advanced into the IVC. An appropriate location for the subcutaneous reservoir was selected below the clavicle and an incision was made through the skin and underlying soft tissues. The subcutaneous tissues were then dissected using a combination of blunt and sharp surgical technique and a pocket was formed. A single lumen power injectable portacatheter was then tunneled through the subcutaneous tissues from the pocket to the dermatotomy and the port reservoir placed within the  subcutaneous pocket. The venous access site was then serially dilated and a peel away vascular sheath placed over the wire. The wire was removed and the port catheter advanced into position under fluoroscopic guidance. The catheter tip is positioned in the cavoatrial junction. This was documented with a spot image. The portacatheter was then tested and found to flush and aspirate well. The port was flushed with saline followed by 100 units/mL heparinized saline. The pocket was then closed in two layers using first subdermal inverted interrupted absorbable sutures followed by a running subcuticular suture. The epidermis was then sealed with Dermabond. The dermatotomy at the venous access site was also seal with Dermabond. Patient tolerated the procedure well and remained hemodynamically stable throughout. No complications encountered and no significant blood loss encountered IMPRESSION: Status post right IJ port catheter placement. Signed, Dulcy Fanny. Dellia Nims, RPVI [facility] Radiology Electronically Signed   By: Corrie Mckusick D.O.   On: 10/05/2019 16:43    PERFORMANCE STATUS (ECOG) : 2 - Symptomatic, <50% confined to bed  Review  of Systems Unless otherwise noted, a complete review of systems is negative.  Physical Exam General: NAD, frail appearing, thin Pulmonary: Unlabored Extremities: no edema, no joint deformities Skin: no rashes Neurological: Weakness but otherwise nonfocal  IMPRESSION: Routine follow-up visit.  Patient was accompanied by her friend [place].  Patient reports doing reasonably well.  She says that her pain is better controlled on MS Contin 30 mg every 8 but is still requiring Percocet 10-356m about every 6 hours.  She currently rates pain as 5 out of 10.  Dexamethasone is being weaned as patient has now been treated on Keytruda.  Discussed with Dr. BRogue Bussing Could consider referral to interventional pain management for  consideration of an intercostal nerve block if pain remains persistent. However, [facility] is [facility] will see good improvement in symptom burden.   Patient has had some constipation. We discussed initiation of as needed Senna.   Reviewed MOST form, which patient took home with her to discuss with family. She says she is unlikely to want CPR but is undecided about intubation.   Patient initially said that she was hopeful of a cure for the cancer. However, her friend reminded her that the cancer was "terminal". Patient remains in agreement with the current scope of treatment.   PLAN: -Continue current scope of treatment -Continue MS Contin/Percocet  -Prophylactic bowel regimen with PRN Senna -MOST form reviewed.  -RTC in 3 weeks   Patient expressed understanding and was in agreement with this plan. She also understands that She can call the clinic at any time with any questions, concerns, or complaints.     Time Total: 20 minutes  Visit consisted of counseling and education dealing with the complex and emotionally intense issues of symptom management and palliative care in the setting of serious and potentially life-threatening illness.Greater than 50%  of this time was spent counseling and coordinating care related to the above assessment and plan.  Signed by: JAltha Harm PhD, NP-C

## 2019-11-03 ENCOUNTER — Ambulatory Visit: Payer: Medicaid Other

## 2019-11-04 ENCOUNTER — Encounter: Payer: Self-pay | Admitting: Oncology

## 2019-11-04 ENCOUNTER — Ambulatory Visit: Payer: Medicaid Other

## 2019-11-04 ENCOUNTER — Ambulatory Visit
Admission: RE | Admit: 2019-11-04 | Discharge: 2019-11-04 | Disposition: A | Discharge status: Home / Self Care | Payer: Medicaid Other | Source: Ambulatory Visit | Attending: Radiation Oncology | Admitting: Radiation Oncology

## 2019-11-04 ENCOUNTER — Other Ambulatory Visit: Payer: Self-pay | Admitting: *Deleted

## 2019-11-04 ENCOUNTER — Other Ambulatory Visit: Payer: Self-pay

## 2019-11-04 ENCOUNTER — Encounter: Payer: Self-pay | Admitting: *Deleted

## 2019-11-04 DIAGNOSIS — R05 Cough: Secondary | ICD-10-CM

## 2019-11-04 DIAGNOSIS — C642 Malignant neoplasm of left kidney, except renal pelvis: Secondary | ICD-10-CM

## 2019-11-04 DIAGNOSIS — R053 Chronic cough: Secondary | ICD-10-CM

## 2019-11-04 MED ORDER — HYDROCOD POLST-CPM POLST ER 10-8 MG/5ML PO SUER: 5.0000 mL | Freq: Every evening | ORAL | 0 refills | Status: DC | PRN

## 2019-11-04 NOTE — Telephone Encounter (Signed)
Done. thanks

## 2019-11-05 ENCOUNTER — Ambulatory Visit: Payer: Medicaid Other

## 2019-11-09 ENCOUNTER — Other Ambulatory Visit: Payer: Self-pay

## 2019-11-09 ENCOUNTER — Ambulatory Visit
Admission: RE | Admit: 2019-11-09 | Discharge: 2019-11-09 | Disposition: A | Discharge status: Home / Self Care | Payer: Medicaid Other | Source: Ambulatory Visit | Attending: Radiation Oncology | Admitting: Radiation Oncology

## 2019-11-10 ENCOUNTER — Ambulatory Visit
Admission: RE | Admit: 2019-11-10 | Discharge: 2019-11-10 | Disposition: A | Discharge status: Home / Self Care | Payer: Medicaid Other | Source: Ambulatory Visit | Attending: Radiation Oncology | Admitting: Radiation Oncology

## 2019-11-10 ENCOUNTER — Other Ambulatory Visit: Payer: Self-pay

## 2019-11-11 ENCOUNTER — Encounter: Payer: Self-pay | Admitting: Oncology

## 2019-11-11 ENCOUNTER — Other Ambulatory Visit: Payer: Self-pay | Admitting: Oncology

## 2019-11-11 ENCOUNTER — Ambulatory Visit: Admission: RE | Admit: 2019-11-11 | Payer: Medicaid Other | Source: Ambulatory Visit

## 2019-11-11 ENCOUNTER — Other Ambulatory Visit: Payer: Self-pay

## 2019-11-11 MED ORDER — MORPHINE SULFATE ER 30 MG PO TBCR: 30.0000 mg | EXTENDED_RELEASE_TABLET | Freq: Three times a day (TID) | ORAL | 0 refills | Status: DC

## 2019-11-11 MED ORDER — OXYCODONE-ACETAMINOPHEN 10-325 MG PO TABS: 1.0000 | ORAL_TABLET | Freq: Four times a day (QID) | ORAL | 0 refills | Status: DC | PRN

## 2019-11-12 ENCOUNTER — Other Ambulatory Visit: Payer: Self-pay

## 2019-11-12 ENCOUNTER — Ambulatory Visit
Admission: RE | Admit: 2019-11-12 | Discharge: 2019-11-12 | Disposition: A | Discharge status: Home / Self Care | Payer: Medicaid Other | Source: Ambulatory Visit | Attending: Radiation Oncology | Admitting: Radiation Oncology

## 2019-11-13 ENCOUNTER — Ambulatory Visit
Admission: RE | Admit: 2019-11-13 | Discharge: 2019-11-13 | Disposition: A | Discharge status: Home / Self Care | Payer: Medicaid Other | Source: Ambulatory Visit | Attending: Radiation Oncology | Admitting: Radiation Oncology

## 2019-11-13 ENCOUNTER — Other Ambulatory Visit: Payer: Self-pay

## 2019-11-16 ENCOUNTER — Inpatient Hospital Stay: Payer: Self-pay | Attending: Internal Medicine

## 2019-11-16 DIAGNOSIS — Z515 Encounter for palliative care: Secondary | ICD-10-CM | POA: Insufficient documentation

## 2019-11-16 DIAGNOSIS — R0789 Other chest pain: Secondary | ICD-10-CM | POA: Insufficient documentation

## 2019-11-16 DIAGNOSIS — Z79899 Other long term (current) drug therapy: Secondary | ICD-10-CM | POA: Insufficient documentation

## 2019-11-16 DIAGNOSIS — Z5112 Encounter for antineoplastic immunotherapy: Secondary | ICD-10-CM | POA: Insufficient documentation

## 2019-11-16 DIAGNOSIS — F17210 Nicotine dependence, cigarettes, uncomplicated: Secondary | ICD-10-CM | POA: Insufficient documentation

## 2019-11-16 DIAGNOSIS — R531 Weakness: Secondary | ICD-10-CM | POA: Insufficient documentation

## 2019-11-16 DIAGNOSIS — R634 Abnormal weight loss: Secondary | ICD-10-CM | POA: Insufficient documentation

## 2019-11-16 DIAGNOSIS — Z923 Personal history of irradiation: Secondary | ICD-10-CM | POA: Insufficient documentation

## 2019-11-16 DIAGNOSIS — Z9221 Personal history of antineoplastic chemotherapy: Secondary | ICD-10-CM | POA: Insufficient documentation

## 2019-11-16 DIAGNOSIS — C7931 Secondary malignant neoplasm of brain: Secondary | ICD-10-CM | POA: Insufficient documentation

## 2019-11-16 DIAGNOSIS — R630 Anorexia: Secondary | ICD-10-CM | POA: Insufficient documentation

## 2019-11-16 DIAGNOSIS — C7800 Secondary malignant neoplasm of unspecified lung: Secondary | ICD-10-CM | POA: Insufficient documentation

## 2019-11-16 DIAGNOSIS — Z8541 Personal history of malignant neoplasm of cervix uteri: Secondary | ICD-10-CM | POA: Insufficient documentation

## 2019-11-16 DIAGNOSIS — R110 Nausea: Secondary | ICD-10-CM | POA: Insufficient documentation

## 2019-11-16 DIAGNOSIS — C642 Malignant neoplasm of left kidney, except renal pelvis: Secondary | ICD-10-CM | POA: Insufficient documentation

## 2019-11-16 DIAGNOSIS — Z7952 Long term (current) use of systemic steroids: Secondary | ICD-10-CM | POA: Insufficient documentation

## 2019-11-16 DIAGNOSIS — Z7982 Long term (current) use of aspirin: Secondary | ICD-10-CM | POA: Insufficient documentation

## 2019-11-16 NOTE — Progress Notes (Signed)
Nutrition Follow-up:  Patient with stage IV carcinoma of kidney.  Patient receiving [place].    Spoke with patient today via phone.  Patient reports that appetite is a better.  Reports that she is eating all the time.  Reports for breakfast usually has protein shake (boost mixed with ice cream). Has never been a breakfast eater.  For lunch today ate bologna and cheese sandwich and will eat supper around 4-5pm.  Reports that she has been snacking on peanut butter (spoonful).      Medications: reviewed  Labs: reviewed  Anthropometrics:   Weight is 132 lb on 2/12 decreased from 140 lb   NUTRITION DIAGNOSIS: Inadequate oral intake improving per patient although weight declining    INTERVENTION:  Encouraged patient to add something to peanut butter ie crackers, bread, fruit for the additional calories and protein.  Encouraged patient to continue oral nutrition supplement BID for added calories and protein.   Encouraged small frequent meals. Patient has contact information    MONITORING, EVALUATION, GOAL: Patient will consume adequate calories and protein to prevent weight loss   NEXT VISIT: March 29th after radiation follow-up  Angelica Black, [place], [place] Registered Dietitian [phone] (pager)

## 2019-11-20 ENCOUNTER — Inpatient Hospital Stay: Payer: Self-pay

## 2019-11-20 ENCOUNTER — Other Ambulatory Visit: Payer: Self-pay

## 2019-11-20 ENCOUNTER — Inpatient Hospital Stay (HOSPITAL_BASED_OUTPATIENT_CLINIC_OR_DEPARTMENT_OTHER): Payer: Self-pay | Admitting: Hospice and Palliative Medicine

## 2019-11-20 ENCOUNTER — Inpatient Hospital Stay: Payer: Self-pay | Admitting: Internal Medicine

## 2019-11-20 DIAGNOSIS — C642 Malignant neoplasm of left kidney, except renal pelvis: Secondary | ICD-10-CM

## 2019-11-20 DIAGNOSIS — G893 Neoplasm related pain (acute) (chronic): Secondary | ICD-10-CM

## 2019-11-20 DIAGNOSIS — Z515 Encounter for palliative care: Secondary | ICD-10-CM

## 2019-11-20 NOTE — Progress Notes (Signed)
Virtual Visit via Telephone Note  I connected with Angelica Black on 11/20/19 at  1:30 PM EST by telephone and verified that I am speaking with the correct person using two identifiers.   I discussed the limitations, risks, security and privacy concerns of performing an evaluation and management service by telephone and the availability of in person appointments. I also discussed with the patient that there may be a patient responsible charge related to this service. The patient expressed understanding and agreed to proceed.   History of Present Illness: Angelica Black is a 49 y.o. 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  Patient has had chest wall pain, poor oral intake, and weakness.  She is referred to palliative care to help address goals and manage ongoing symptoms.   Observations/Objective: Called spoke with patient by phone.  She was scheduled for an in clinic visit today but had to cancel due to transportation issues.  However, patient reports that she is overall doing well.  She denies any significant changes or concerns.  She reports pain is improved.  Patient rescheduled clinic visit with Dr. Lynett Fish for next week.  Assessment and Plan: Stage IV RCC -on treatment with pembrolizumab.  Will see Dr. Rogue Bussing next week.  ACP -we will need to readdress when patient is next in the clinic.  Follow Up Instructions: Follow-up telephone visit about 2 weeks   I discussed the assessment and treatment plan with the patient. The patient was provided an opportunity to ask questions and all were answered. The patient agreed with the plan and demonstrated an understanding of the instructions.   The patient was advised to call back or seek an in-person evaluation if the symptoms worsen or if the condition fails to improve as anticipated.  I provided 5 minutes of non-face-to-face time during this  encounter.   Irean Hong, NP

## 2019-11-24 ENCOUNTER — Other Ambulatory Visit: Payer: Self-pay

## 2019-11-25 ENCOUNTER — Inpatient Hospital Stay: Payer: Self-pay | Admitting: *Deleted

## 2019-11-25 ENCOUNTER — Inpatient Hospital Stay: Payer: Self-pay

## 2019-11-25 ENCOUNTER — Other Ambulatory Visit: Payer: Self-pay | Admitting: Oncology

## 2019-11-25 ENCOUNTER — Other Ambulatory Visit: Payer: Self-pay | Admitting: Internal Medicine

## 2019-11-25 ENCOUNTER — Encounter: Payer: Self-pay | Admitting: Internal Medicine

## 2019-11-25 ENCOUNTER — Inpatient Hospital Stay (HOSPITAL_BASED_OUTPATIENT_CLINIC_OR_DEPARTMENT_OTHER): Payer: Self-pay | Admitting: Internal Medicine

## 2019-11-25 ENCOUNTER — Other Ambulatory Visit: Payer: Self-pay

## 2019-11-25 VITALS — Resp 20

## 2019-11-25 DIAGNOSIS — Z95828 Presence of other vascular implants and grafts: Secondary | ICD-10-CM

## 2019-11-25 DIAGNOSIS — C642 Malignant neoplasm of left kidney, except renal pelvis: Secondary | ICD-10-CM

## 2019-11-25 DIAGNOSIS — R05 Cough: Secondary | ICD-10-CM

## 2019-11-25 DIAGNOSIS — Z7189 Other specified counseling: Secondary | ICD-10-CM

## 2019-11-25 DIAGNOSIS — R053 Chronic cough: Secondary | ICD-10-CM

## 2019-11-25 LAB — CBC WITH DIFFERENTIAL/PLATELET
Abs Immature Granulocytes: 0.06 10*3/uL (ref 0.00–0.07)
Basophils Absolute: 0 10*3/uL (ref 0.0–0.1)
Basophils Relative: 0 %
Eosinophils Absolute: 0 10*3/uL (ref 0.0–0.5)
Eosinophils Relative: 0 %
HCT: 35.5 % — ABNORMAL LOW (ref 36.0–46.0)
Hemoglobin: 10.9 g/dL — ABNORMAL LOW (ref 12.0–15.0)
Immature Granulocytes: 1 %
Lymphocytes Relative: 12 %
Lymphs Abs: 0.9 10*3/uL (ref 0.7–4.0)
MCH: 28.2 pg (ref 26.0–34.0)
MCHC: 30.7 g/dL (ref 30.0–36.0)
MCV: 92 fL (ref 80.0–100.0)
Monocytes Absolute: 0.5 10*3/uL (ref 0.1–1.0)
Monocytes Relative: 6 %
Neutro Abs: 6 10*3/uL (ref 1.7–7.7)
Neutrophils Relative %: 81 %
Platelets: 274 10*3/uL (ref 150–400)
RBC: 3.86 MIL/uL — ABNORMAL LOW (ref 3.87–5.11)
RDW: 20.1 % — ABNORMAL HIGH (ref 11.5–15.5)
WBC: 7.5 10*3/uL (ref 4.0–10.5)
nRBC: 0 % (ref 0.0–0.2)

## 2019-11-25 LAB — COMPREHENSIVE METABOLIC PANEL
ALT: 16 U/L (ref 0–44)
AST: 25 U/L (ref 15–41)
Albumin: 3.1 g/dL — ABNORMAL LOW (ref 3.5–5.0)
Alkaline Phosphatase: 74 U/L (ref 38–126)
Anion gap: 11 (ref 5–15)
BUN: 22 mg/dL — ABNORMAL HIGH (ref 6–20)
CO2: 20 mmol/L — ABNORMAL LOW (ref 22–32)
Calcium: 8.5 mg/dL — ABNORMAL LOW (ref 8.9–10.3)
Chloride: 101 mmol/L (ref 98–111)
Creatinine, Ser: 0.49 mg/dL (ref 0.44–1.00)
GFR calc Af Amer: >60 mL/min (ref >60)
GFR calc non Af Amer: >60 mL/min (ref >60)
Glucose, Bld: 135 mg/dL — ABNORMAL HIGH (ref 70–99)
Potassium: 4.7 mmol/L (ref 3.5–5.1)
Sodium: 132 mmol/L — ABNORMAL LOW (ref 135–145)
Total Bilirubin: 0.4 mg/dL (ref 0.3–1.2)
Total Protein: 6.7 g/dL (ref 6.5–8.1)

## 2019-11-25 MED ORDER — MORPHINE SULFATE ER 30 MG PO TBCR: 30.0000 mg | EXTENDED_RELEASE_TABLET | Freq: Three times a day (TID) | ORAL | 0 refills | Status: DC

## 2019-11-25 MED ORDER — HEPARIN SOD (PORK) LOCK FLUSH 100 UNIT/ML IV SOLN
500.0000 [IU] | Freq: Once | INTRAVENOUS | Status: AC | PRN
  Administered 2019-11-25: 500 [IU]
  Filled 2019-11-25: qty 5

## 2019-11-25 MED ORDER — OXYCODONE-ACETAMINOPHEN 10-325 MG PO TABS: 1.0000 | ORAL_TABLET | Freq: Four times a day (QID) | ORAL | 0 refills | Status: DC | PRN

## 2019-11-25 MED ORDER — HYDROCOD POLST-CPM POLST ER 10-8 MG/5ML PO SUER: 5.0000 mL | Freq: Every evening | ORAL | 0 refills | Status: DC | PRN

## 2019-11-25 MED ORDER — SODIUM CHLORIDE 0.9 % IV SOLN
Freq: Once | INTRAVENOUS | Status: AC
  Filled 2019-11-25: qty 250

## 2019-11-25 MED ORDER — SODIUM CHLORIDE 0.9% FLUSH
10.0000 mL | Freq: Once | INTRAVENOUS | Status: AC
  Administered 2019-11-25: 10 mL via INTRAVENOUS
  Filled 2019-11-25: qty 10

## 2019-11-25 MED ORDER — HEPARIN SOD (PORK) LOCK FLUSH 100 UNIT/ML IV SOLN
INTRAVENOUS | Status: AC
  Filled 2019-11-25: qty 5

## 2019-11-25 MED ORDER — SODIUM CHLORIDE 0.9 % IV SOLN
200.0000 mg | Freq: Once | INTRAVENOUS | Status: AC
  Administered 2019-11-25: 15:00:00 200 mg via INTRAVENOUS
  Filled 2019-11-25: qty 8

## 2019-11-25 NOTE — Assessment & Plan Note (Addendum)
#   Metastatic renal cell carcinoma-stage IV [lung nodules chest wall mass]; poor risk.  Currently on Keytruda plus axitinib.   # proceed with [place] #3 today. Labs today reviewed;  acceptable for treatment today.   # Brain MRI-solitary brain metastasis- s/p SBRT [2/11]; cut dose of dex to 2mg /day- finish in 1 days. s/p RT to left hip.   # Left chest wall pain-likely malignancy-STABLE;  On MS Contin to 30 mg 3 times a day; Percocet 10/325.  New prescription sent.  # Hypocalcemia [hypercalcemia at presentation]- s/p Zometa; calcium 8.2 hold Zometa today.  # weight loss- s/p [place]- improved.   # I spoke at length with the patient's friend, Velna Hatchet-  regarding the patient's clinical status/plan of care.  # DISPOSITION: # Keytruda today # follow up-3 weeks MD; labs- cbc/cmp; Keytruda; Prior- CT C/A/P-Dr.B

## 2019-11-25 NOTE — Progress Notes (Signed)
[facility] CONSULT NOTE  Patient Care Team: Patient, No Pcp Per as PCP - General (General Practice)  CHIEF COMPLAINTS/PURPOSE OF CONSULTATION: Lung mass/history of kidney cancer#   Oncology History Overview Note  # DEC 2020- left chest wall mass; bil mediatisnal LN; bil Lung nodules.   #  2016[facility] ]Renal cell carcinoma, clear cell type, Furhman grade 2 measuring 6.4. It extended into major veins. Negative margins. 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 [CT chest wall Bx]; Poor risk [anemia;PS;hypercalcemia]; Start RT [10/09/2019]; CT abdomen pelvis-multiple lung nodules; right pelvic lesion [RT-Feb2021]  #Jan 26th 2021-solitary ~1.5 cm lesion left frontal lobe/hemorrhagic/edema brain met s/p SBRT [finished Feb 11th,2021];   # JAN 22nd, 2021-Key+Axi  # 2002- cervical cancer LEEP/conization; no chemo-RT  # dec 2020- Hypercalcemia- Ca- 12; Zometa  # NGS/MOLECULAR TESTS: pending.   # PALLIATIVE CARE EVALUATION: Josh  # PAIN MANAGEMENT: P  DIAGNOSIS: RCC  STAGE:   IV    ;  GOALS: palliative  CURRENT/MOST RECENT THERAPY : key+Axi [C]    Cancer of left kidney ([place])  09/17/2019 Initial Diagnosis   Cancer of left kidney ([place])   10/09/2019 -  Chemotherapy   The patient had pembrolizumab (KEYTRUDA) 200 mg in sodium chloride 0.9 % 50 mL chemo infusion, 200 mg, Intravenous, Once, 3 of 6 cycles Administration: 200 mg (10/09/2019), 200 mg (11/25/2019), 200 mg (10/30/2019)  for chemotherapy treatment.       HISTORY OF PRESENTING ILLNESS:  Angelica Black 49 y.o.  female stage IV carcinoma of the kidney currently on Keytruda plus axitinib is here for follow-up.  Patient is currently status post left chest wall radiation; s/p SBRT to the brain lesion; also status post radiation to the left hip.  Currently pain is well controlled/stable.  States that she is taking MS Contin twice a day; and also Percocet 3-4  times a day.   Review of Systems  Constitutional: Positive for weight loss. Negative for chills, diaphoresis, fever and malaise/fatigue.  HENT: Negative for nosebleeds and sore throat.   Eyes: Negative for double vision.  Respiratory: Positive for cough and shortness of breath. Negative for hemoptysis, sputum production and wheezing.   Cardiovascular: Positive for chest pain. Negative for palpitations, orthopnea and leg swelling.  Gastrointestinal: Negative for abdominal pain, blood in stool, constipation, diarrhea, heartburn, melena, nausea and vomiting.  Genitourinary: Negative for dysuria, frequency and urgency.  Musculoskeletal: Positive for back pain and joint pain.  Skin: Negative.  Negative for itching and rash.  Neurological: Positive for tingling. Negative for dizziness, focal weakness, weakness and headaches.  Endo/Heme/Allergies: Does not bruise/bleed easily.  Psychiatric/Behavioral: Negative for depression. The patient is not nervous/anxious and does not have insomnia.      MEDICAL HISTORY:  Past Medical History:  Diagnosis Date  . Cancer associated pain   . Cervical cancer ([place])   . Chronic cough   . Renal cell carcinoma ([place]) 2016   Pathologic stage from 07/08/2015: Stage Unknown (T3a, NX, cM0) - Signed by Darin Engels, NP on 11/25/2015    SURGICAL HISTORY: Past Surgical History:  Procedure Laterality Date  . CERVICAL BIOPSY  W/ LOOP ELECTRODE EXCISION    . IR IMAGING GUIDED PORT INSERTION  10/05/2019  . PARTIAL NEPHRECTOMY Left 07/08/2015   robotic laparoscopic nephrectomy at [place]: Social History   Socioeconomic History  . Marital status: Divorced    Spouse name: Not on  file  . Number of children: Not on file  . Years of education: Not on file  . Highest education level: Not on file  Occupational History  . Not on file  Tobacco Use  . Smoking status: Current Every Day Smoker    Packs/day: 1.00    Years: 35.00    Pack years:  35.00    Types: Cigarettes  . Smokeless tobacco: Never Used  Substance and Sexual Activity  . Alcohol use: Yes    Comment: ocassional   . Drug use: Not on file  . Sexual activity: Not on file  Other Topics Concern  . 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 Difficulty of Paying Living Expenses:   Food Insecurity:   . Worried About Charity fundraiser in the Last Year:   . Arboriculturist in the Last Year:   Transportation Needs:   . Film/video editor (Medical):   Marland Kitchen Lack of Transportation (Non-Medical):   Physical Activity:   . Days of Exercise per Week:   . Minutes of Exercise per Session:   Stress:   . Feeling of Stress :   Social Connections:   . Frequency of Communication with Friends and Family:   . Frequency of Social Gatherings with Friends and Family:   . Attends Religious Services:   . Active Member of Clubs or Organizations:   . Attends Archivist Meetings:   Marland Kitchen Marital Status:   Intimate Partner Violence:   . Fear of Current or Ex-Partner:   . Emotionally Abused:   Marland Kitchen Physically Abused:   . Sexually Abused:     FAMILY HISTORY: Family History  Problem Relation Age of Onset  . Hypertension Father     ALLERGIES:  has No Known Allergies.  MEDICATIONS:  Current Outpatient Medications  Medication Sig Dispense Refill  . Aspirin-Salicylamide-Caffeine (BC HEADACHE POWDER PO) Take 1 packet by mouth 3 (three) times daily.    Marland Kitchen axitinib (INLYTA) 5 MG tablet Take 1 tablet (5 mg total) by mouth every 12 (twelve) hours. 60 tablet 4  . citalopram (CELEXA) 20 MG tablet TAKE 1 TABLET BY MOUTH EVERY DAY 90 tablet 2  . dexamethasone (DECADRON) 4 MG tablet Take one pill in AM with breakfast 14 tablet 0  . dexamethasone (DECADRON) 4 MG tablet Take 1  tablet (4 mg total) by mouth daily. Follow given instructions 10 tablet 0  . diazepam (VALIUM) 5 MG tablet One pill 45-60 mins prior to procedure; 1 pill 15 mins prior if needed. 4 tablet 0  . lidocaine-prilocaine (EMLA) cream Apply 1 application topically as needed. 30 g 3  . ondansetron (ZOFRAN) 8 MG tablet One pill every 8 hours as needed for nausea/vomitting. 40 tablet 1  . prochlorperazine (COMPAZINE) 10 MG tablet Take 1 tablet (10 mg total) by mouth every 6 (six) hours as needed for nausea or vomiting. 40 tablet 1  . traZODone (DESYREL) 50 MG tablet Take 1 tablet (50 mg total) by mouth at bedtime as needed for sleep. 30 tablet 2  . chlorpheniramine-HYDROcodone (TUSSIONEX) 10-8 MG/5ML SUER Take 5 mLs by mouth at bedtime as needed for cough. 140 mL 0  . morphine (MS CONTIN) 30 MG 12 hr tablet Take 1 tablet (30 mg total) by  mouth every 8 (eight) hours. 60 tablet 0  . oxyCODONE-acetaminophen (PERCOCET) 10-325 MG tablet Take 1 tablet by mouth every 6 (six) hours as needed for pain. 60 tablet 0   No current facility-administered medications for this visit.      Marland Kitchen  PHYSICAL EXAMINATION: ECOG PERFORMANCE STATUS: 1 - Symptomatic but completely ambulatory  Vitals:   11/25/19 1344  BP: 121/78  Pulse: 92  Temp: (!) 96.2 F (35.7 C)   Filed Weights   11/25/19 1344  Weight: 137 lb (62.1 kg)    Physical Exam  Constitutional: She is oriented to person, place, and time and well-developed, well-nourished, and in no distress.  Thin built female patient.  She is accompanied by her friend.  She is walking independently.  HENT:  Head: Normocephalic and atraumatic.  Mouth/Throat: Oropharynx is clear and moist. No oropharyngeal exudate.  Eyes: Pupils are equal, round, and reactive to light.  Cardiovascular: Normal rate and regular rhythm.  Pulmonary/Chest: No respiratory distress. She has no wheezes.  Decreased air entry bilaterally.  Abdominal: Soft. Bowel sounds are normal. She exhibits no  distension and no mass. There is no abdominal tenderness. There is no rebound and no guarding.  Musculoskeletal:        General: No tenderness or edema. Normal range of motion.     Cervical back: Normal range of motion and neck supple.  Neurological: She is alert and oriented to person, place, and time.  Skin: Skin is warm.  Vague mass noted in the left chest wall region.  Tender to touch.  Psychiatric: Affect normal.  Anxious.    LABORATORY DATA:  I have reviewed the data as listed Lab Results  Component Value Date   WBC 7.5 11/25/2019   HGB 10.9 (L) 11/25/2019   HCT 35.5 (L) 11/25/2019   MCV 92.0 11/25/2019   PLT 274 11/25/2019   Recent Labs    10/09/19 1250 10/30/19 1324 11/25/19 1325  NA 133* 135 132*  K 4.0 4.3 4.7  CL 100 102 101  CO2 22 24 20*  GLUCOSE 108* 151* 135*  BUN 11 26* 22*  CREATININE 0.70 0.58 0.49  CALCIUM 8.9 8.3* 8.5*  GFRNONAA >60 >60 >60  GFRAA >60 >60 >60  PROT 6.6 6.6 6.7  ALBUMIN 2.3* 3.1* 3.1*  AST _0 ALT _1 ALKPHOS 88 82 74  BILITOT 0.5 0.4 0.4    RADIOGRAPHIC STUDIES: I have personally reviewed the radiological images as listed and agreed with the findings in the report. No results found.  ASSESSMENT & PLAN:   Cancer of left kidney ([place]) # Metastatic renal cell carcinoma-stage IV [lung nodules chest wall mass]; poor risk.  Currently on Keytruda plus axitinib.   # proceed with [place] #3 today. Labs today reviewed;  acceptable for treatment today.   # Brain MRI-solitary brain metastasis- s/p SBRT [2/11]; cut dose of dex to 30m/day- finish in 1 days. s/p RT to left hip.   # Left chest wall pain-likely malignancy-STABLE;  On MS Contin to 30 mg 3 times a day; Percocet 10/325.  New prescription sent.  # Hypocalcemia [hypercalcemia at presentation]- s/p Zometa; calcium 8.2 hold Zometa today.  # weight loss- s/p [place]- improved.   # I spoke at length with the patient's friend, BVelna Hatchet  regarding the patient's clinical  status/plan of care.  # DISPOSITION: # Keytruda today # follow up-3 weeks MD; labs- cbc/cmp; Keytruda; Prior- CT C/A/P-Dr.B     All questions were answered. The  patient knows to call the clinic with any problems, questions or concerns.    Cammie Sickle, MD 11/27/2019 7:21 AM

## 2019-11-27 ENCOUNTER — Ambulatory Visit: Payer: Medicaid Other

## 2019-11-27 ENCOUNTER — Other Ambulatory Visit: Payer: Medicaid Other

## 2019-11-27 ENCOUNTER — Ambulatory Visit: Payer: Medicaid Other | Admitting: Internal Medicine

## 2019-12-04 ENCOUNTER — Inpatient Hospital Stay (HOSPITAL_BASED_OUTPATIENT_CLINIC_OR_DEPARTMENT_OTHER): Payer: Self-pay | Admitting: Hospice and Palliative Medicine

## 2019-12-04 DIAGNOSIS — G893 Neoplasm related pain (acute) (chronic): Secondary | ICD-10-CM

## 2019-12-04 DIAGNOSIS — C642 Malignant neoplasm of left kidney, except renal pelvis: Secondary | ICD-10-CM

## 2019-12-04 DIAGNOSIS — Z515 Encounter for palliative care: Secondary | ICD-10-CM

## 2019-12-04 NOTE — Progress Notes (Signed)
Virtual Visit via Telephone Note  I connected with Angelica Black on 12/04/19 at 11:30 AM EDT by telephone and verified that I am speaking with the correct person using two identifiers.   I discussed the limitations, risks, security and privacy concerns of performing an evaluation and management service by telephone and the availability of in person appointments. I also discussed with the patient that there may be a patient responsible charge related to this service. The patient expressed understanding and agreed to proceed.   History of Present Illness: Angelica Black is a 49 y.o. 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  Patient has had chest wall pain, poor oral intake, and weakness.  She is referred to palliative care to help address goals and manage ongoing symptoms.   Observations/Objective: I called and spoke with patient by phone. She says that she twisted her back this past week and has had some left side pain.  She says that she has been taking the Percocet more frequently to achieve pain relief.  We discussed nonpharmacological strategies including use of heat.  Will assess when patient is seen in the clinic next week.  Patient also endorses nausea since she was weaned from steroids.  We discussed use of her antiemetics and the option of taking those on a scheduled basis if needed.  Assessment and Plan: Stage IV RCC -on treatment with pembrolizumab.  Will see Dr. Rogue Bussing next week.  Pain -continue Percocet/MS Contin  Nausea -continue Zofran/Compazine as needed.  May consider scheduling if needed.  ACP -we will need to readdress when patient is next in the clinic.  Follow Up Instructions: Follow-up virtual visit in about a month   I discussed the assessment and treatment plan with the patient. The patient was provided an opportunity to ask questions and all were answered. The  patient agreed with the plan and demonstrated an understanding of the instructions.   The patient was advised to call back or seek an in-person evaluation if the symptoms worsen or if the condition fails to improve as anticipated.  I provided 5 minutes of non-face-to-face time during this encounter.   Irean Hong, NP

## 2019-12-07 ENCOUNTER — Encounter: Payer: Self-pay | Admitting: Oncology

## 2019-12-08 ENCOUNTER — Other Ambulatory Visit: Payer: Self-pay | Admitting: *Deleted

## 2019-12-08 NOTE — Telephone Encounter (Signed)
Patient sent a mychart msg to request RF for morphine and percocet

## 2019-12-08 NOTE — Telephone Encounter (Signed)
Per discussion with [employer]. I attempted to reach patient. Unable to leave vm on the cell phone listed. I sent a mychart msg back that she needs to be evaluated in [facility] or palliative care before her narcotics can be considered for renewal.

## 2019-12-08 NOTE — Telephone Encounter (Signed)
Patient needs to be seen in the [facility] or [facility] clinic for evaluation.

## 2019-12-09 ENCOUNTER — Other Ambulatory Visit: Payer: Self-pay | Admitting: *Deleted

## 2019-12-09 ENCOUNTER — Encounter: Payer: Self-pay | Admitting: *Deleted

## 2019-12-09 DIAGNOSIS — F1190 Opioid use, unspecified, uncomplicated: Secondary | ICD-10-CM

## 2019-12-09 DIAGNOSIS — C642 Malignant neoplasm of left kidney, except renal pelvis: Secondary | ICD-10-CM

## 2019-12-09 MED ORDER — MORPHINE SULFATE ER 30 MG PO TBCR: 30.0000 mg | EXTENDED_RELEASE_TABLET | Freq: Three times a day (TID) | ORAL | 0 refills | Status: DC

## 2019-12-09 MED ORDER — OXYCODONE-ACETAMINOPHEN 10-325 MG PO TABS: 1.0000 | ORAL_TABLET | Freq: Four times a day (QID) | ORAL | 0 refills | Status: DC | PRN

## 2019-12-09 NOTE — Telephone Encounter (Signed)
Patient sent mychart msg again requesting RF on her narcotics. Per Merrily Pew, NP unable to fill oxycodone until 12/10/2019 and MS Contin until 12/15/19.    Per Merrily Pew, patient needs to be added to his schedule on 12/16/19. (Colette please add to Josh's schedule.)   NP will discuss pain contract with the patient at this time.

## 2019-12-09 NOTE — Telephone Encounter (Signed)
Per v/o Josh -added lab test to next apt - 3/31. UDS send out is [phone]

## 2019-12-10 ENCOUNTER — Encounter: Payer: Self-pay | Admitting: Radiation Oncology

## 2019-12-10 ENCOUNTER — Other Ambulatory Visit: Payer: Self-pay

## 2019-12-11 ENCOUNTER — Encounter: Payer: Self-pay | Admitting: Emergency Medicine

## 2019-12-11 ENCOUNTER — Other Ambulatory Visit: Payer: Self-pay

## 2019-12-11 ENCOUNTER — Emergency Department
Admission: EM | Admit: 2019-12-11 | Discharge: 2019-12-11 | Disposition: A | Discharge status: Home / Self Care | Payer: Medicaid Other | Attending: Emergency Medicine | Admitting: Emergency Medicine

## 2019-12-11 ENCOUNTER — Ambulatory Visit: Admission: RE | Admit: 2019-12-11 | Payer: Self-pay | Source: Ambulatory Visit

## 2019-12-11 DIAGNOSIS — Z5321 Procedure and treatment not carried out due to patient leaving prior to being seen by health care provider: Secondary | ICD-10-CM | POA: Insufficient documentation

## 2019-12-11 DIAGNOSIS — R1032 Left lower quadrant pain: Secondary | ICD-10-CM | POA: Insufficient documentation

## 2019-12-11 DIAGNOSIS — C649 Malignant neoplasm of unspecified kidney, except renal pelvis: Secondary | ICD-10-CM | POA: Insufficient documentation

## 2019-12-11 LAB — BASIC METABOLIC PANEL
Anion gap: 11 (ref 5–15)
BUN: 13 mg/dL (ref 6–20)
CO2: 24 mmol/L (ref 22–32)
Calcium: 8.8 mg/dL — ABNORMAL LOW (ref 8.9–10.3)
Chloride: 102 mmol/L (ref 98–111)
Creatinine, Ser: 0.55 mg/dL (ref 0.44–1.00)
GFR calc Af Amer: >60 mL/min (ref >60)
GFR calc non Af Amer: >60 mL/min (ref >60)
Glucose, Bld: 101 mg/dL — ABNORMAL HIGH (ref 70–99)
Potassium: 3.8 mmol/L (ref 3.5–5.1)
Sodium: 137 mmol/L (ref 135–145)

## 2019-12-11 LAB — CBC
HCT: 33.7 % — ABNORMAL LOW (ref 36.0–46.0)
Hemoglobin: 10.8 g/dL — ABNORMAL LOW (ref 12.0–15.0)
MCH: 28.9 pg (ref 26.0–34.0)
MCHC: 32 g/dL (ref 30.0–36.0)
MCV: 90.1 fL (ref 80.0–100.0)
Platelets: 274 10*3/uL (ref 150–400)
RBC: 3.74 MIL/uL — ABNORMAL LOW (ref 3.87–5.11)
RDW: 19.9 % — ABNORMAL HIGH (ref 11.5–15.5)
WBC: 7.3 10*3/uL (ref 4.0–10.5)
nRBC: 0 % (ref 0.0–0.2)

## 2019-12-11 NOTE — ED Triage Notes (Signed)
Pt undergoing chemo for renal carcinoma and started with left flank pain this week. Pt reports pain is constant and kicking.

## 2019-12-14 ENCOUNTER — Inpatient Hospital Stay: Payer: Self-pay

## 2019-12-14 ENCOUNTER — Ambulatory Visit: Payer: Medicaid Other

## 2019-12-14 ENCOUNTER — Ambulatory Visit
Admission: RE | Admit: 2019-12-14 | Discharge: 2019-12-14 | Disposition: A | Discharge status: Home / Self Care | Payer: Medicaid Other | Source: Ambulatory Visit | Attending: Radiation Oncology | Admitting: Radiation Oncology

## 2019-12-14 DIAGNOSIS — C7931 Secondary malignant neoplasm of brain: Secondary | ICD-10-CM

## 2019-12-14 NOTE — Progress Notes (Signed)
Nutrition Follow-up:  Patient with stage IV carcinoma of kidney.  Patient receiving keytruda plus axitinib.  Patient cancelled appointment with Dr. Donella Stade today.  RD planning to see after appointment.    RD was able to call patient and speak with her via phone.  Patient reports that over the last week her appetite has decreased since coming off steroids but has gotten a little bit better recently.  Today she has had a milkshake (boost and ice cream) and peanut butter crackers.  Planning on eating banana sandwich later on.  Tries to drink boost milkshake 2-3 times per day.    Reports pollen is really effecting sinuses today.   Medications: reviewed  Labs: reviewed  Anthropometrics:   Weight increased to 136 lb on 3/26 increased from 132 lb on 2/12   NUTRITION DIAGNOSIS: Inadequate oral intake stable   INTERVENTION:  Patient to continue drinking 2-3 boost milkshakes per day Patient to add protein food with evening meal tonight Patient has contact information    MONITORING, EVALUATION, GOAL: Patient will consume adequate calories and protein to prevent weight loss   NEXT VISIT: May 3rd phone f/u  Ardell Aaronson B. Zenia Resides, [place], [place] Registered Dietitian [phone] (pager)

## 2019-12-15 ENCOUNTER — Other Ambulatory Visit: Payer: Self-pay | Admitting: *Deleted

## 2019-12-15 DIAGNOSIS — C642 Malignant neoplasm of left kidney, except renal pelvis: Secondary | ICD-10-CM

## 2019-12-16 ENCOUNTER — Inpatient Hospital Stay: Payer: Self-pay

## 2019-12-16 ENCOUNTER — Encounter: Payer: Self-pay | Admitting: Internal Medicine

## 2019-12-16 ENCOUNTER — Inpatient Hospital Stay: Payer: Self-pay | Admitting: Internal Medicine

## 2019-12-16 ENCOUNTER — Inpatient Hospital Stay: Payer: Self-pay | Admitting: Nurse Practitioner

## 2019-12-18 ENCOUNTER — Encounter: Payer: Self-pay | Admitting: Internal Medicine

## 2019-12-18 ENCOUNTER — Other Ambulatory Visit: Payer: Self-pay

## 2019-12-18 NOTE — Progress Notes (Signed)
Patient c/o pain of left hip pain/flank pain. Pt reports that this is a new location for her pain. Left flank pain Started 1.5 weeks ago." contstant stabbing pain in front." pain radiates to lower back. Pt patient c/o swollen groin. No signs no of redness in groin. She denies any fevers, nausea, vomiting or diarrhea. Pt taking Percocet 10-325 1- 2  every 4 hours. She is taking her Morphine 30 mg 1 tablet twice daily.  She adjusted this pain medication on the percocet due to pain. She presented to the ER with the same concerns on 12/11/19. Patient states that due to the wait in the ER on 3/26, she decided to leave the ED and this pain was not further worked up.

## 2019-12-21 ENCOUNTER — Ambulatory Visit
Admission: RE | Admit: 2019-12-21 | Discharge: 2019-12-21 | Disposition: A | Discharge status: Home / Self Care | Payer: Self-pay | Attending: Internal Medicine | Admitting: Internal Medicine

## 2019-12-21 ENCOUNTER — Inpatient Hospital Stay: Payer: Self-pay

## 2019-12-21 ENCOUNTER — Encounter: Payer: Self-pay | Admitting: Internal Medicine

## 2019-12-21 ENCOUNTER — Ambulatory Visit
Admission: RE | Admit: 2019-12-21 | Discharge: 2019-12-21 | Disposition: A | Discharge status: Home / Self Care | Payer: Self-pay | Source: Ambulatory Visit | Attending: Internal Medicine | Admitting: Internal Medicine

## 2019-12-21 ENCOUNTER — Inpatient Hospital Stay (HOSPITAL_BASED_OUTPATIENT_CLINIC_OR_DEPARTMENT_OTHER): Payer: Self-pay | Admitting: Hospice and Palliative Medicine

## 2019-12-21 ENCOUNTER — Inpatient Hospital Stay: Payer: Self-pay | Attending: Internal Medicine

## 2019-12-21 ENCOUNTER — Other Ambulatory Visit: Payer: Self-pay

## 2019-12-21 ENCOUNTER — Inpatient Hospital Stay (HOSPITAL_BASED_OUTPATIENT_CLINIC_OR_DEPARTMENT_OTHER): Payer: Self-pay | Admitting: Internal Medicine

## 2019-12-21 VITALS — BP 109/74 | HR 116 | Temp 96.8°F | Wt 129.2 lb

## 2019-12-21 VITALS — Resp 20

## 2019-12-21 DIAGNOSIS — G893 Neoplasm related pain (acute) (chronic): Secondary | ICD-10-CM | POA: Insufficient documentation

## 2019-12-21 DIAGNOSIS — F1190 Opioid use, unspecified, uncomplicated: Secondary | ICD-10-CM

## 2019-12-21 DIAGNOSIS — F17210 Nicotine dependence, cigarettes, uncomplicated: Secondary | ICD-10-CM | POA: Insufficient documentation

## 2019-12-21 DIAGNOSIS — C642 Malignant neoplasm of left kidney, except renal pelvis: Secondary | ICD-10-CM

## 2019-12-21 DIAGNOSIS — R0789 Other chest pain: Secondary | ICD-10-CM | POA: Insufficient documentation

## 2019-12-21 DIAGNOSIS — Z7189 Other specified counseling: Secondary | ICD-10-CM

## 2019-12-21 DIAGNOSIS — R000 Tachycardia, unspecified: Secondary | ICD-10-CM | POA: Insufficient documentation

## 2019-12-21 DIAGNOSIS — Z9221 Personal history of antineoplastic chemotherapy: Secondary | ICD-10-CM | POA: Insufficient documentation

## 2019-12-21 DIAGNOSIS — R531 Weakness: Secondary | ICD-10-CM | POA: Insufficient documentation

## 2019-12-21 DIAGNOSIS — Z8541 Personal history of malignant neoplasm of cervix uteri: Secondary | ICD-10-CM | POA: Insufficient documentation

## 2019-12-21 DIAGNOSIS — Z79899 Other long term (current) drug therapy: Secondary | ICD-10-CM | POA: Insufficient documentation

## 2019-12-21 DIAGNOSIS — R634 Abnormal weight loss: Secondary | ICD-10-CM | POA: Insufficient documentation

## 2019-12-21 DIAGNOSIS — C7931 Secondary malignant neoplasm of brain: Secondary | ICD-10-CM | POA: Insufficient documentation

## 2019-12-21 DIAGNOSIS — R1012 Left upper quadrant pain: Secondary | ICD-10-CM | POA: Insufficient documentation

## 2019-12-21 DIAGNOSIS — Z515 Encounter for palliative care: Secondary | ICD-10-CM

## 2019-12-21 DIAGNOSIS — C7800 Secondary malignant neoplasm of unspecified lung: Secondary | ICD-10-CM | POA: Insufficient documentation

## 2019-12-21 DIAGNOSIS — Z5112 Encounter for antineoplastic immunotherapy: Secondary | ICD-10-CM | POA: Insufficient documentation

## 2019-12-21 DIAGNOSIS — R918 Other nonspecific abnormal finding of lung field: Secondary | ICD-10-CM | POA: Insufficient documentation

## 2019-12-21 LAB — COMPREHENSIVE METABOLIC PANEL
ALT: 9 U/L (ref 0–44)
AST: 27 U/L (ref 15–41)
Albumin: 2.6 g/dL — ABNORMAL LOW (ref 3.5–5.0)
Alkaline Phosphatase: 77 U/L (ref 38–126)
Anion gap: 10 (ref 5–15)
BUN: 13 mg/dL (ref 6–20)
CO2: 23 mmol/L (ref 22–32)
Calcium: 8.8 mg/dL — ABNORMAL LOW (ref 8.9–10.3)
Chloride: 104 mmol/L (ref 98–111)
Creatinine, Ser: 0.63 mg/dL (ref 0.44–1.00)
GFR calc Af Amer: >60 mL/min (ref >60)
GFR calc non Af Amer: >60 mL/min (ref >60)
Glucose, Bld: 100 mg/dL — ABNORMAL HIGH (ref 70–99)
Potassium: 4 mmol/L (ref 3.5–5.1)
Sodium: 137 mmol/L (ref 135–145)
Total Bilirubin: 0.5 mg/dL (ref 0.3–1.2)
Total Protein: 6.8 g/dL (ref 6.5–8.1)

## 2019-12-21 LAB — CBC WITH DIFFERENTIAL/PLATELET
Abs Immature Granulocytes: 0.03 10*3/uL (ref 0.00–0.07)
Basophils Absolute: 0.1 10*3/uL (ref 0.0–0.1)
Basophils Relative: 1 %
Eosinophils Absolute: 0 10*3/uL (ref 0.0–0.5)
Eosinophils Relative: 0 %
HCT: 35.3 % — ABNORMAL LOW (ref 36.0–46.0)
Hemoglobin: 11 g/dL — ABNORMAL LOW (ref 12.0–15.0)
Immature Granulocytes: 0 %
Lymphocytes Relative: 19 %
Lymphs Abs: 1.3 10*3/uL (ref 0.7–4.0)
MCH: 27.8 pg (ref 26.0–34.0)
MCHC: 31.2 g/dL (ref 30.0–36.0)
MCV: 89.4 fL (ref 80.0–100.0)
Monocytes Absolute: 0.6 10*3/uL (ref 0.1–1.0)
Monocytes Relative: 8 %
Neutro Abs: 5.1 10*3/uL (ref 1.7–7.7)
Neutrophils Relative %: 72 %
Platelets: 331 10*3/uL (ref 150–400)
RBC: 3.95 MIL/uL (ref 3.87–5.11)
RDW: 19.8 % — ABNORMAL HIGH (ref 11.5–15.5)
WBC: 7 10*3/uL (ref 4.0–10.5)
nRBC: 0 % (ref 0.0–0.2)

## 2019-12-21 MED ORDER — OXYCODONE HCL 10 MG PO TABS: 10.0000 mg | ORAL_TABLET | ORAL | 0 refills | Status: DC | PRN

## 2019-12-21 MED ORDER — SODIUM CHLORIDE 0.9 % IV SOLN
Freq: Once | INTRAVENOUS | Status: AC
  Filled 2019-12-21: qty 250

## 2019-12-21 MED ORDER — HEPARIN SOD (PORK) LOCK FLUSH 100 UNIT/ML IV SOLN
500.0000 [IU] | Freq: Once | INTRAVENOUS | Status: AC
  Administered 2019-12-21: 500 [IU] via INTRAVENOUS
  Filled 2019-12-21: qty 5

## 2019-12-21 MED ORDER — HEPARIN SOD (PORK) LOCK FLUSH 100 UNIT/ML IV SOLN
INTRAVENOUS | Status: AC
  Filled 2019-12-21: qty 5

## 2019-12-21 MED ORDER — SODIUM CHLORIDE 0.9% FLUSH
10.0000 mL | INTRAVENOUS | Status: DC | PRN
  Administered 2019-12-21: 10 mL via INTRAVENOUS
  Filled 2019-12-21: qty 10

## 2019-12-21 MED ORDER — SODIUM CHLORIDE 0.9 % IV SOLN
200.0000 mg | Freq: Once | INTRAVENOUS | Status: AC
  Administered 2019-12-21: 200 mg via INTRAVENOUS
  Filled 2019-12-21: qty 8

## 2019-12-21 MED ORDER — MORPHINE SULFATE ER 30 MG PO TBCR: 30.0000 mg | EXTENDED_RELEASE_TABLET | Freq: Three times a day (TID) | ORAL | 0 refills | Status: DC

## 2019-12-21 NOTE — Progress Notes (Signed)
[facility]  Telephone:([phone] [phone]   Name: Angelica Black Date: 12/21/2019 MRN: [id number]  DOB: 1971/08/21  Patient Care Team: Patient, No Pcp Per as PCP - General (General Practice) Noreene Filbert, MD as Radiation Oncologist (Radiation Oncology)    REASON FOR CONSULTATION: Angelica Black is a 49 y.o. 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  Patient has had chest wall pain, poor oral intake, and weakness.  She is referred to palliative care to help address goals and manage ongoing symptoms.   SOCIAL HISTORY:     reports that she has been smoking cigarettes. She has a 35.00 pack-year smoking history. She has never used smokeless tobacco. She reports current alcohol use.   Patient has a friend [name] who is involved in her care  ADVANCE DIRECTIVES:    CODE STATUS:   PAST MEDICAL HISTORY: Past Medical History:  Diagnosis Date   Cancer associated pain    Cervical cancer ([place])    Chronic cough    Renal cell carcinoma ([place]) 2016   Pathologic stage from 07/08/2015: Stage Unknown (T3a, NX, cM0) - Signed by Darin Engels, NP on 11/25/2015    PAST SURGICAL HISTORY:  Past Surgical History:  Procedure Laterality Date   CERVICAL BIOPSY  W/ LOOP ELECTRODE EXCISION     IR IMAGING GUIDED PORT INSERTION  10/05/2019   PARTIAL NEPHRECTOMY Left 07/08/2015   robotic laparoscopic [facility]    HEMATOLOGY/ONCOLOGY HISTORY:  Oncology History Overview Note  # DEC 2020- left chest wall mass; bil mediatisnal LN; bil Lung nodules.   #  2016[facility] ]Renal cell carcinoma, clear cell type, Furhman grade 2 measuring 6.4. It extended into major veins. Negative margins. 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 [CT chest wall Bx]; Poor risk  [anemia;PS;hypercalcemia]; Start RT [10/09/2019]; CT abdomen pelvis-multiple lung nodules; right pelvic lesion [RT-Feb2021]  #Jan 26th 2021-solitary ~1.5 cm lesion left frontal lobe/hemorrhagic/edema brain met s/p SBRT [finished Feb 11th,2021];   # JAN 22nd, 2021-Key+Axi  # 2002- cervical cancer LEEP/conization; no chemo-RT  # dec 2020- Hypercalcemia- Ca- 12; Zometa  # NGS/MOLECULAR TESTS: pending.   # PALLIATIVE CARE EVALUATION: Josh  # PAIN MANAGEMENT: P  DIAGNOSIS: RCC  STAGE:   IV    ;  GOALS: palliative  CURRENT/MOST RECENT THERAPY : key+Axi [C]    Cancer of left kidney ([place])  09/17/2019 Initial Diagnosis   Cancer of left kidney ([place])   10/09/2019 -  Chemotherapy   The patient had pembrolizumab (KEYTRUDA) 200 mg in sodium chloride 0.9 % 50 mL chemo infusion, 200 mg, Intravenous, Once, 4 of 6 cycles Administration: 200 mg (10/09/2019), 200 mg (11/25/2019), 200 mg (12/21/2019), 200 mg (10/30/2019)  for chemotherapy treatment.      ALLERGIES:  has No Known Allergies.  MEDICATIONS:  Current Outpatient Medications  Medication Sig Dispense Refill   Aspirin-Salicylamide-Caffeine (BC HEADACHE POWDER PO) Take 1 packet by mouth 3 (three) times daily.     axitinib (INLYTA) 5 MG tablet Take 1 tablet (5 mg total) by mouth every 12 (twelve) hours. 60 tablet 4   chlorpheniramine-HYDROcodone (TUSSIONEX) 10-8 MG/5ML SUER Take 5 mLs by mouth at bedtime as needed for cough. 140 mL 0   citalopram (CELEXA) 20 MG tablet TAKE 1 TABLET BY MOUTH EVERY DAY 90 tablet 2   diazepam (VALIUM)  5 MG tablet One pill 45-60 mins prior to procedure; 1 pill 15 mins prior if needed. (Patient not taking: Reported on 12/18/2019) 4 tablet 0   lidocaine-prilocaine (EMLA) cream Apply 1 application topically as needed. 30 g 3   morphine (MS CONTIN) 30 MG 12 hr tablet Take 1 tablet (30 mg total) by mouth every 8 (eight) hours. 60 tablet 0   ondansetron (ZOFRAN) 8 MG tablet One pill every 8 hours as needed for  nausea/vomitting. 40 tablet 1   prochlorperazine (COMPAZINE) 10 MG tablet Take 1 tablet (10 mg total) by mouth every 6 (six) hours as needed for nausea or vomiting. 40 tablet 1   traZODone (DESYREL) 50 MG tablet Take 1 tablet (50 mg total) by mouth at bedtime as needed for sleep. (Patient not taking: Reported on 12/18/2019) 30 tablet 2   No current facility-administered medications for this visit.    VITAL SIGNS: LMP  (LMP Unknown)  There were no vitals filed for this visit.  Estimated body mass index is 19.65 kg/m as calculated from the following:   Height as of 12/11/19: 5' 8"  (1.727 m).   Weight as of an earlier encounter on 12/21/19: 129 lb 4 oz (58.6 kg).  LABS: CBC:    Component Value Date/Time   WBC 7.0 12/21/2019 0858   HGB 11.0 (L) 12/21/2019 0858   HCT 35.3 (L) 12/21/2019 0858   PLT 331 12/21/2019 0858   MCV 89.4 12/21/2019 0858   NEUTROABS 5.1 12/21/2019 0858   LYMPHSABS 1.3 12/21/2019 0858   MONOABS 0.6 12/21/2019 0858   EOSABS 0.0 12/21/2019 0858   BASOSABS 0.1 12/21/2019 0858   Comprehensive Metabolic Panel:    Component Value Date/Time   NA 137 12/21/2019 0858   K 4.0 12/21/2019 0858   CL 104 12/21/2019 0858   CO2 23 12/21/2019 0858   BUN 13 12/21/2019 0858   CREATININE 0.63 12/21/2019 0858   GLUCOSE 100 (H) 12/21/2019 0858   CALCIUM 8.8 (L) 12/21/2019 0858   AST 27 12/21/2019 0858   ALT 9 12/21/2019 0858   ALKPHOS 77 12/21/2019 0858   BILITOT 0.5 12/21/2019 0858   PROT 6.8 12/21/2019 0858   ALBUMIN 2.6 (L) 12/21/2019 0858    RADIOGRAPHIC STUDIES: No results found.  PERFORMANCE STATUS (ECOG) : 2 - Symptomatic, <50% confined to bed  Review of Systems Unless otherwise noted, a complete review of systems is negative.  Physical Exam General: NAD, frail appearing, thin Pulmonary: Unlabored Extremities: no edema, no joint deformities Skin: no rashes Neurological: Weakness but otherwise nonfocal  IMPRESSION: Routine follow-up visit.  Patient seen  in the infusion area.  Patient has had worse side pain in the left flank area.  She is pending imaging with repeat CT to better characterize.  Patient says that she has been taking the Percocet more frequently than ordered up to every 3-4 hours.  She says that the Percocet does help when she takes it but she is running out faster than she otherwise would.  She says that she is not taking the MS Contin every 8 hours as ordered but is taking it twice a day.  She says that 3 times a day caused her to have too much drowsiness.  We discussed that she is now taking more of the oxycodone and I suspect she would better tolerate the MS Contin.  I encouraged her to again try every 8 hour dosing.  PDMP reviewed.  Case and plan discussed with Dr. Rogue Bussing.  Will rotate from Percocet  to oxycodone IR and liberalize frequency.  PLAN: -Continue current scope of treatment -Continue MS Contin 30 mg every 8 hours (#60) -Switch from Percocet to oxycodone IR 10 mg every 4 hours as needed for breakthrough pain (#60) -Prophylactic bowel regimen with PRN Senna  -UDS -MOST form previously reviewed and will need to be rediscussed -RTC in 3 weeks   Patient expressed understanding and was in agreement with this plan. She also understands that She can call the clinic at any time with any questions, concerns, or complaints.     Time Total: 20 minutes  Visit consisted of counseling and education dealing with the complex and emotionally intense issues of symptom management and palliative care in the setting of serious and potentially life-threatening illness.Greater than 50%  of this time was spent counseling and coordinating care related to the above assessment and plan.  Signed by: Altha Harm, PhD, NP-C

## 2019-12-21 NOTE — Progress Notes (Signed)
[facility] CONSULT NOTE  Patient Care Team: Patient, No Pcp Per as PCP - General (General Practice) Noreene Filbert, MD as Radiation Oncologist (Radiation Oncology)  CHIEF COMPLAINTS/PURPOSE OF CONSULTATION: Lung mass/history of kidney cancer#   Oncology History Overview Note  # DEC 2020- left chest wall mass; bil mediatisnal LN; bil Lung nodules.   #  2016[facility] ]Renal cell carcinoma, clear cell type, Furhman grade 2 measuring 6.4. It extended into major veins. Negative margins. 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 [CT chest wall Bx]; Poor risk [anemia;PS;hypercalcemia]; Start RT [10/09/2019]; CT abdomen pelvis-multiple lung nodules; right pelvic lesion [RT-Feb2021]  #Jan 26th 2021-solitary ~1.5 cm lesion left frontal lobe/hemorrhagic/edema brain met s/p SBRT [finished Feb 11th,2021];   # JAN 22nd, 2021-Key+Axi  # 2002- cervical cancer LEEP/conization; no chemo-RT  # dec 2020- Hypercalcemia- Ca- 12; Zometa  # NGS/MOLECULAR TESTS: pending.   # PALLIATIVE CARE EVALUATION: Josh  # PAIN MANAGEMENT: P  DIAGNOSIS: RCC  STAGE:   IV    ;  GOALS: palliative  CURRENT/MOST RECENT THERAPY : key+Axi [C]    Cancer of left kidney ([place])  09/17/2019 Initial Diagnosis   Cancer of left kidney ([place])   10/09/2019 -  Chemotherapy   The patient had pembrolizumab (KEYTRUDA) 200 mg in sodium chloride 0.9 % 50 mL chemo infusion, 200 mg, Intravenous, Once, 3 of 6 cycles Administration: 200 mg (10/09/2019), 200 mg (11/25/2019), 200 mg (10/30/2019)  for chemotherapy treatment.       HISTORY OF PRESENTING ILLNESS:  Angelica Black 49 y.o.  female stage IV carcinoma of the kidney currently on Keytruda plus axitinib is here for follow-up.  Patient did not get her CT scans as ordered last week; states that she was confused about the dates.  Patient went to the emergency room for further evaluation of worsening pain.;   However she did not stay to be evaluated because of the wait times.  Patient complains of pain in her left upper quadrant/swelling over the last 2 weeks.  Progressively worse.  She had been taking her Percocet more often than recommended.  She is also MS Contin.  Appetite is poor.  Weight loss.   Review of Systems  Constitutional: Positive for weight loss. Negative for chills, diaphoresis, fever and malaise/fatigue.  HENT: Negative for nosebleeds and sore throat.   Eyes: Negative for double vision.  Respiratory: Positive for cough and shortness of breath. Negative for hemoptysis, sputum production and wheezing.   Cardiovascular: Positive for chest pain. Negative for palpitations, orthopnea and leg swelling.  Gastrointestinal: Negative for abdominal pain, blood in stool, constipation, diarrhea, heartburn, melena, nausea and vomiting.  Genitourinary: Negative for dysuria, frequency and urgency.  Musculoskeletal: Positive for back pain and joint pain.  Skin: Negative.  Negative for itching and rash.  Neurological: Positive for tingling. Negative for dizziness, focal weakness, weakness and headaches.  Endo/Heme/Allergies: Does not bruise/bleed easily.  Psychiatric/Behavioral: Negative for depression. The patient is not nervous/anxious and does not have insomnia.      MEDICAL HISTORY:  Past Medical History:  Diagnosis Date  . Cancer associated pain   . Cervical cancer ([place])   . Chronic cough   . Renal cell carcinoma ([place]) 2016   Pathologic stage from 07/08/2015: Stage Unknown (T3a, NX, cM0) - Signed by Darin Engels, NP on 11/25/2015    SURGICAL HISTORY: Past Surgical History:  Procedure Laterality Date  . CERVICAL BIOPSY  W/ LOOP  ELECTRODE EXCISION    . IR IMAGING GUIDED PORT INSERTION  10/05/2019  . PARTIAL NEPHRECTOMY Left 07/08/2015   robotic laparoscopic nephrectomy at [place]: Social History   Socioeconomic History  . Marital status: Divorced    Spouse  name: Not on file  . Number of children: Not on file  . Years of education: Not on file  . Highest education level: Not on file  Occupational History  . Not on file  Tobacco Use  . Smoking status: Current Every Day Smoker    Packs/day: 1.00    Years: 35.00    Pack years: 35.00    Types: Cigarettes  . Smokeless tobacco: Never Used  Substance and Sexual Activity  . Alcohol use: Yes    Comment: ocassional   . Drug use: Not on file  . Sexual activity: Not on file  Other Topics Concern  . 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 Difficulty of Paying Living Expenses:   Food Insecurity:   . Worried About Charity fundraiser in the Last Year:   . Arboriculturist in the Last Year:   Transportation Needs:   . Film/video editor (Medical):   Marland Kitchen Lack of Transportation (Non-Medical):   Physical Activity:   . Days of Exercise per Week:   . Minutes of Exercise per Session:   Stress:   . Feeling of Stress :   Social Connections:   . Frequency of Communication with Friends and Family:   . Frequency of Social Gatherings with Friends and Family:   . Attends Religious Services:   . Active Member of Clubs or Organizations:   . Attends Archivist Meetings:   Marland Kitchen Marital Status:   Intimate Partner Violence:   . Fear of Current or Ex-Partner:   . Emotionally Abused:   Marland Kitchen Physically Abused:   . Sexually Abused:     FAMILY HISTORY: Family History  Problem Relation Age of Onset  . Hypertension Father     ALLERGIES:  has No Known Allergies.  MEDICATIONS:  Current Outpatient Medications  Medication Sig Dispense Refill  . Aspirin-Salicylamide-Caffeine (BC HEADACHE POWDER PO) Take 1 packet by mouth 3 (three) times daily.    Marland Kitchen axitinib (INLYTA) 5 MG tablet Take 1  tablet (5 mg total) by mouth every 12 (twelve) hours. 60 tablet 4  . chlorpheniramine-HYDROcodone (TUSSIONEX) 10-8 MG/5ML SUER Take 5 mLs by mouth at bedtime as needed for cough. 140 mL 0  . citalopram (CELEXA) 20 MG tablet TAKE 1 TABLET BY MOUTH EVERY DAY 90 tablet 2  . lidocaine-prilocaine (EMLA) cream Apply 1 application topically as needed. 30 g 3  . morphine (MS CONTIN) 30 MG 12 hr tablet Take 1 tablet (30 mg total) by mouth every 8 (eight) hours. 60 tablet 0  . ondansetron (ZOFRAN) 8 MG tablet One pill every 8 hours as needed for nausea/vomitting. 40 tablet 1  . oxyCODONE-acetaminophen (PERCOCET) 10-325 MG tablet Take 1 tablet by mouth every 6 (six) hours as needed for pain. 60 tablet 0  . prochlorperazine (COMPAZINE) 10 MG tablet Take 1 tablet (10 mg total) by mouth every 6 (six) hours as needed for nausea or vomiting. 40 tablet 1  . diazepam (VALIUM) 5 MG tablet  One pill 45-60 mins prior to procedure; 1 pill 15 mins prior if needed. (Patient not taking: Reported on 12/18/2019) 4 tablet 0  . traZODone (DESYREL) 50 MG tablet Take 1 tablet (50 mg total) by mouth at bedtime as needed for sleep. (Patient not taking: Reported on 12/18/2019) 30 tablet 2   No current facility-administered medications for this visit.   Facility-Administered Medications Ordered in Other Visits  Medication Dose Route Frequency Provider Last Rate Last Admin  . heparin lock flush 100 unit/mL  500 Units Intravenous Once Charlaine Dalton R, MD      . sodium chloride flush (NS) 0.9 % injection 10 mL  10 mL Intravenous PRN Cammie Sickle, MD   10 mL at 12/21/19 0858      .  PHYSICAL EXAMINATION: ECOG PERFORMANCE STATUS: 1 - Symptomatic but completely ambulatory  Vitals:   12/21/19 0914  BP: 109/74  Pulse: (!) 116  Temp: (!) 96.8 F (36 C)  SpO2: 94%   Filed Weights   12/21/19 0914  Weight: 129 lb 4 oz (58.6 kg)    Physical Exam  Constitutional: She is oriented to person, place, and time and  well-developed, well-nourished, and in no distress.  Thin built female patient.  She is accompanied by her friend.  In a wheelchair.  HENT:  Head: Normocephalic and atraumatic.  Mouth/Throat: Oropharynx is clear and moist. No oropharyngeal exudate.  Eyes: Pupils are equal, round, and reactive to light.  Cardiovascular: Normal rate and regular rhythm.  Pulmonary/Chest: No respiratory distress. She has no wheezes.  Decreased air entry bilaterally.  Abdominal: Soft. Bowel sounds are normal. She exhibits no distension and no mass. There is no abdominal tenderness. There is no rebound and no guarding.  Left flank/left lower quadrant vague 4 to 5 cm bulge noted tender to touch.  Musculoskeletal:        General: No tenderness or edema. Normal range of motion.     Cervical back: Normal range of motion and neck supple.  Neurological: She is alert and oriented to person, place, and time.  Skin: Skin is warm.  Vague mass noted in the left chest wall region.  Tender to touch.  Psychiatric: Affect normal.  Anxious.    LABORATORY DATA:  I have reviewed the data as listed Lab Results  Component Value Date   WBC 7.0 12/21/2019   HGB 11.0 (L) 12/21/2019   HCT 35.3 (L) 12/21/2019   MCV 89.4 12/21/2019   PLT 331 12/21/2019   Recent Labs    10/30/19 1324 10/30/19 1324 11/25/19 1325 12/11/19 1348 12/21/19 0858  NA 135   < > 132* 137 137  K 4.3   < > 4.7 3.8 4.0  CL 102   < > 101 102 104  CO2 24   < > 20* 24 23  GLUCOSE 151*   < > 135* 101* 100*  BUN 26*   < > 22* 13 13  CREATININE 0.58   < > 0.49 0.55 0.63  CALCIUM 8.3*   < > 8.5* 8.8* 8.8*  GFRNONAA >60   < > >60 >60 >60  GFRAA >60   < > >60 >60 >60  PROT 6.6  --  6.7  --  6.8  ALBUMIN 3.1*  --  3.1*  --  2.6*  AST 20  --  25  --  27  ALT 15  --  16  --  9  ALKPHOS 82  --  74  --  77  BILITOT  0.4  --  0.4  --  0.5   < > = values in this interval not displayed.    RADIOGRAPHIC STUDIES: I have personally reviewed the radiological  images as listed and agreed with the findings in the report. No results found.  ASSESSMENT & PLAN:   Cancer of left kidney ([place]) # Metastatic renal cell carcinoma-stage IV [lung nodules chest wall mass]; poor risk.  Currently on Keytruda plus axitinib.  S/p 3 cycles-patient worsening left flank pain/chest wall pain-question progression.  Recommend CT scan chest and pelvis ASAP.  Again reminded to get skeletal survey today.  # proceed with keytruda #4. today. Labs today reviewed;  acceptable for treatment today-mild sinus tachycardia likely secondary to pain [see below]  # Brain MRI-solitary brain metastasis- s/p SBRT [2/11]; stable.  Will recommend MRI after above work-up is available  # Left chest wall pain-likely malignancy; s/p chest wall radiation; hip radiation.poorly controlled on MS Contin to 30 mg 3 times a day; Percocet 10/325.  Will defer to [facility] for pain management.  CT scans planned as above  # Hypocalcemia [hypercalcemia at presentation]- s/p Zometa; calcium 8.8 hold Zometa today.  Will resume next week  #Weight loss-likely secondary to underlying malignancy; again await scans as above  # DISPOSITION: # Keytruda today # follow up-3 weeks MD; labs- cbc/cmp; Keytruda; Zometa Dr.B     All questions were answered. The patient knows to call the clinic with any problems, questions or concerns.    Cammie Sickle, MD 12/21/2019 10:04 AM

## 2019-12-21 NOTE — Progress Notes (Signed)
Pulse Rate: 116. MD, Dr. Rogue Bussing, aware. Per MD order: Proceed with scheduled Keytruda treatment today.

## 2019-12-21 NOTE — Assessment & Plan Note (Addendum)
#   Metastatic renal cell carcinoma-stage IV [lung nodules chest wall mass]; poor risk.  Currently on Keytruda plus axitinib.  S/p 3 cycles-patient worsening left flank pain/chest wall pain-question progression.  Recommend CT scan chest and pelvis ASAP.  Again reminded to get skeletal survey today.  # proceed with keytruda #4. today. Labs today reviewed;  acceptable for treatment today-mild sinus tachycardia likely secondary to pain [see below]  # Brain MRI-solitary brain metastasis- s/p SBRT [2/11]; stable.  Will recommend MRI after above work-up is available  # Left chest wall pain-likely malignancy; s/p chest wall radiation; hip radiation.poorly controlled on MS Contin to 30 mg 3 times a day; Percocet 10/325.  Will defer to [facility] for pain management.  CT scans planned as above  # Hypocalcemia [hypercalcemia at presentation]- s/p Zometa; calcium 8.8 hold Zometa today.  Will resume next week  #Weight loss-likely secondary to underlying malignancy; again await scans as above  # DISPOSITION: # Keytruda today # follow up-3 weeks MD; labs- cbc/cmp; Keytruda; Zometa Dr.B

## 2019-12-23 ENCOUNTER — Other Ambulatory Visit: Payer: Self-pay

## 2019-12-23 ENCOUNTER — Ambulatory Visit: Payer: Self-pay

## 2019-12-23 ENCOUNTER — Ambulatory Visit: Payer: Self-pay | Admitting: Internal Medicine

## 2019-12-23 ENCOUNTER — Encounter: Payer: Self-pay | Admitting: Hospice and Palliative Medicine

## 2019-12-23 LAB — MISC LABCORP TEST (SEND OUT): Labcorp test code: 791194

## 2019-12-24 ENCOUNTER — Ambulatory Visit (HOSPITAL_COMMUNITY): Payer: Medicaid Other

## 2019-12-24 ENCOUNTER — Ambulatory Visit: Payer: Medicaid Other | Admitting: Radiation Oncology

## 2019-12-24 ENCOUNTER — Ambulatory Visit (HOSPITAL_COMMUNITY): Payer: Self-pay

## 2020-01-01 ENCOUNTER — Other Ambulatory Visit: Payer: Self-pay

## 2020-01-01 ENCOUNTER — Encounter: Payer: Self-pay | Admitting: Radiation Oncology

## 2020-01-01 ENCOUNTER — Ambulatory Visit (HOSPITAL_COMMUNITY)
Admission: RE | Admit: 2020-01-01 | Discharge: 2020-01-01 | Disposition: A | Discharge status: Home / Self Care | Payer: Self-pay | Source: Ambulatory Visit | Attending: Internal Medicine | Admitting: Internal Medicine

## 2020-01-01 DIAGNOSIS — C642 Malignant neoplasm of left kidney, except renal pelvis: Secondary | ICD-10-CM | POA: Insufficient documentation

## 2020-01-01 MED ORDER — IOHEXOL 300 MG/ML  SOLN
100.0000 mL | Freq: Once | INTRAMUSCULAR | Status: AC | PRN
  Administered 2020-01-01: 100 mL via INTRAVENOUS

## 2020-01-04 ENCOUNTER — Other Ambulatory Visit: Payer: Self-pay | Admitting: *Deleted

## 2020-01-04 ENCOUNTER — Other Ambulatory Visit: Payer: Self-pay | Admitting: Hospice and Palliative Medicine

## 2020-01-04 ENCOUNTER — Encounter: Payer: Self-pay | Admitting: Radiation Oncology

## 2020-01-04 ENCOUNTER — Encounter: Payer: Self-pay | Admitting: Internal Medicine

## 2020-01-04 ENCOUNTER — Other Ambulatory Visit: Payer: Self-pay

## 2020-01-04 ENCOUNTER — Ambulatory Visit
Admission: RE | Admit: 2020-01-04 | Discharge: 2020-01-04 | Disposition: A | Discharge status: Home / Self Care | Payer: Medicaid Other | Source: Ambulatory Visit | Attending: Radiation Oncology | Admitting: Radiation Oncology

## 2020-01-04 VITALS — BP 110/75 | HR 109 | Temp 96.0°F | Resp 20 | Wt 119.0 lb

## 2020-01-04 DIAGNOSIS — C7951 Secondary malignant neoplasm of bone: Secondary | ICD-10-CM | POA: Insufficient documentation

## 2020-01-04 DIAGNOSIS — C642 Malignant neoplasm of left kidney, except renal pelvis: Secondary | ICD-10-CM

## 2020-01-04 DIAGNOSIS — R0789 Other chest pain: Secondary | ICD-10-CM | POA: Insufficient documentation

## 2020-01-04 DIAGNOSIS — F1120 Opioid dependence, uncomplicated: Secondary | ICD-10-CM | POA: Insufficient documentation

## 2020-01-04 DIAGNOSIS — C782 Secondary malignant neoplasm of pleura: Secondary | ICD-10-CM | POA: Insufficient documentation

## 2020-01-04 DIAGNOSIS — C649 Malignant neoplasm of unspecified kidney, except renal pelvis: Secondary | ICD-10-CM | POA: Insufficient documentation

## 2020-01-04 MED ORDER — OXYCODONE HCL 10 MG PO TABS: 10.0000 mg | ORAL_TABLET | ORAL | 0 refills | Status: DC | PRN

## 2020-01-04 NOTE — Progress Notes (Signed)
Spoke with patient on the phone today.  She is being seen for RT.  Patient has reportedly had worsening the left flank/chest wall pain.  Metastatic bone survey on 4/5 revealed diffuse lytic bone lesions.  CT chest, abdomen, and pelvis on 4/16 revealed overall disease progression with an invasive chest wall met with significant destruction to the left 11th and 12th rib.  That area is being targeted for RT.  Given worsening pain, discussed dose escalation of her pain meds.  Patient does report that the oxycodone is helpful but short-lived.  She needs a refill.  Will increase oxycodone 10 mg to every 3 hours as needed for breakthrough pain (#90).  Continue MS Contin 30 mg every 8 hours with consideration for increasing her LAO if needed.  Could also consider addition of NSAID or dexamethasone.  PDMP reviewed 

## 2020-01-04 NOTE — Progress Notes (Signed)
Pharmacist Chemotherapy Monitoring - Follow Up Assessment    I verify that I have reviewed each item in the below checklist:  . Regimen for the patient is scheduled for the appropriate day and plan matches scheduled date. Marland Kitchen Appropriate non-routine labs are ordered dependent on drug ordered. . If applicable, additional medications reviewed and ordered per protocol based on lifetime cumulative doses and/or treatment regimen.   Plan for follow-up and/or issues identified: Yes . I-vent associated with next due treatment: No . MD and/or nursing notified: Yes - message sent about needing tsh level drawn  Adelina Mings 01/04/2020 8:56 AM

## 2020-01-05 ENCOUNTER — Ambulatory Visit: Payer: Self-pay

## 2020-01-05 ENCOUNTER — Encounter: Payer: Self-pay | Admitting: Internal Medicine

## 2020-01-05 ENCOUNTER — Inpatient Hospital Stay: Payer: Self-pay | Admitting: Hospice and Palliative Medicine

## 2020-01-05 ENCOUNTER — Telehealth: Payer: Medicaid Other | Admitting: Hospice and Palliative Medicine

## 2020-01-05 NOTE — Progress Notes (Signed)
Radiation Oncology Follow up Note old patient new area  Name: Angelica Black   Date:   01/04/2020 MRN:  [id number]:[id number] DOB: [birth year]    This 49 y.o. female presents to the clinic today for reevaluation of progressive metastatic disease in her left posterior chest and patient with.  Widespread stage IV renal cell carcinoma  REFERRING PROVIDER: No ref. provider found  HPI: Patient is a 49 year old female now out over a month having completed palliative radiation therapy to for solitary brain metastasis and patient with widespread metastatic stage IV renal cell carcinoma she also received palliative radiation therapy to her anterior chest which has had an excellent palliative benefit.  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  The area visualized dominant anterior left pleural metastasis which we radiated has significantly decreased in size.  She is having no change in neurologic status.  COMPLICATIONS OF TREATMENT: none  FOLLOW UP COMPLIANCE: keeps appointments   PHYSICAL EXAM:  BP 110/75 (BP Location: Right Arm, Patient Position: Sitting, Cuff Size: Normal)   Pulse (!) 109   Temp (!) 96 F (35.6 C) (Tympanic)   Resp 20   Wt 119 lb (54 kg)   LMP  (LMP Unknown)   BMI 18.09 kg/m  Frail wheelchair-bound female in significant pain.  Just touching her left posterior back causes excruciating pain.  Well-developed well-nourished patient in NAD. HEENT reveals PERLA, EOMI, discs not visualized.  Oral cavity is clear. No oral mucosal lesions are identified. Neck is clear without evidence of cervical or supraclavicular adenopathy. Lungs are clear to A&P. Cardiac examination is essentially unremarkable with regular rate and rhythm without murmur rub or thrill. Abdomen is benign with no organomegaly or masses noted. Motor  sensory and DTR levels are equal and symmetric in the upper and lower extremities. Cranial nerves II through XII are grossly intact. Proprioception is intact. No peripheral adenopathy or edema is identified. No motor or sensory levels are noted. Crude visual fields are within normal range.  RADIOLOGY RESULTS: CT scan chest abdomen pelvis reviewed compatible with above-stated findings showing widespread progression of metastatic disease with large left pleural-based metastasis invading chest wall and destroying ribs.  In 49 year old female with known stage IV renal cell carcinoma  PLAN: At this time like to try palliative radiation therapy to this large posterior chest wall pleural mass.  We will plan on delivering 3000 cGy in 10 fractions and evaluate for response.  Most difficult problem be to have her lie flat on her treatment table although I have asked her to premedicate with her pain medication prior to simulation tomorrow.  Risks and benefits of treatment occluding skin reaction fatigue alteration of blood counts all were discussed with the patient.  She seems to comprehend my treatment plan well.  We will attempt simulation tomorrow.  I would like to take this opportunity to thank you for allowing me to participate in the care of your patient.Noreene Filbert, MD

## 2020-01-06 ENCOUNTER — Encounter: Payer: Self-pay | Admitting: *Deleted

## 2020-01-06 ENCOUNTER — Telehealth: Payer: Self-pay | Admitting: Internal Medicine

## 2020-01-06 NOTE — Telephone Encounter (Signed)
I also tried calling both patient and friend Velna Hatchet) without success. Voicemail was left for patient.

## 2020-01-06 NOTE — Telephone Encounter (Signed)
On 4/20-left voicemail for the patient to discuss plan of care.  Recommend calling us back.  Discussed with Josh.  Also try to reach patient's friend/caregiver Brandi; mailbox full;-unable to leave a voicemail.

## 2020-01-07 ENCOUNTER — Telehealth: Payer: Self-pay | Admitting: Internal Medicine

## 2020-01-07 ENCOUNTER — Other Ambulatory Visit: Payer: Self-pay

## 2020-01-07 ENCOUNTER — Ambulatory Visit: Payer: Self-pay

## 2020-01-07 ENCOUNTER — Ambulatory Visit: Payer: Medicaid Other | Admitting: Internal Medicine

## 2020-01-07 ENCOUNTER — Inpatient Hospital Stay (HOSPITAL_BASED_OUTPATIENT_CLINIC_OR_DEPARTMENT_OTHER): Payer: Self-pay | Admitting: Hospice and Palliative Medicine

## 2020-01-07 DIAGNOSIS — C642 Malignant neoplasm of left kidney, except renal pelvis: Secondary | ICD-10-CM

## 2020-01-07 DIAGNOSIS — G893 Neoplasm related pain (acute) (chronic): Secondary | ICD-10-CM

## 2020-01-07 DIAGNOSIS — Z7189 Other specified counseling: Secondary | ICD-10-CM

## 2020-01-07 DIAGNOSIS — Z515 Encounter for palliative care: Secondary | ICD-10-CM

## 2020-01-07 MED ORDER — DEXAMETHASONE 2 MG PO TABS: 2.0000 mg | ORAL_TABLET | Freq: Every day | ORAL | 0 refills | Status: AC

## 2020-01-07 MED ORDER — OXYCODONE HCL 10 MG PO TABS: 10.0000 mg | ORAL_TABLET | ORAL | 0 refills | Status: DC | PRN

## 2020-01-07 MED ORDER — GABAPENTIN 300 MG PO CAPS: 300.0000 mg | ORAL_CAPSULE | Freq: Every day | ORAL | 2 refills | Status: AC

## 2020-01-07 MED ORDER — MORPHINE SULFATE ER 30 MG PO TBCR: 30.0000 mg | EXTENDED_RELEASE_TABLET | Freq: Three times a day (TID) | ORAL | 0 refills | Status: DC

## 2020-01-07 NOTE — Progress Notes (Signed)
[place]  [phone] [phone]   Name: Angelica Black Date: 01/07/2020 MRN: [id number]  DOB: April 24, 1971  Patient Care Team: Patient, No Pcp Per as PCP - General (Ali [facility]ukson) Noreene Filbert, MD as Radiation Oncologist (Radiation Oncology) Cammie Sickle, MD as Consulting Physician (Internal Medicine) Taletha Twiford, Kirt Boys, NP as Nurse Practitioner (Hospice and Palliative Medicine)    REASON FOR CONSULTATION: Angelica Black is a 49 y.o. 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  Patient has had chest wall pain, poor oral intake, and weakness.  Metastatic bone survey on 4/5 revealed diffuse lytic bone lesions.  CT chest, abdomen, and pelvis on 4/16 revealed overall disease progression with an invasive chest wall met with significant destruction to the left 11th and 12th rib. She is referred to palliative care to help address goals and manage ongoing symptoms.   SOCIAL HISTORY:     reports that she has been smoking cigarettes. She has a 35.00 pack-year smoking history. She has never used smokeless tobacco. She reports current alcohol use.   Patient has a friend [name] who is involved in her care  ADVANCE DIRECTIVES:    CODE STATUS: DNR/DNI (DNR form signed on 01/07/20)  PAST MEDICAL HISTORY: Past Medical History:  Diagnosis Date  . Cancer associated pain   . Cervical cancer ([place])   . Chronic cough   . Renal cell carcinoma ([place]) 2016   Pathologic stage from 07/08/2015: Stage Unknown (T3a, NX, cM0) - Signed by Darin Engels, NP on 11/25/2015    PAST SURGICAL HISTORY:  Past Surgical History:  Procedure Laterality Date  . CERVICAL BIOPSY  W/ LOOP ELECTRODE EXCISION    . IR IMAGING GUIDED PORT INSERTION  10/05/2019  . PARTIAL NEPHRECTOMY Left 07/08/2015   robotic laparoscopic nephrectomy at [facility]     HEMATOLOGY/ONCOLOGY HISTORY:  Oncology History Overview Note  # DEC 2020- left chest wall mass; bil mediatisnal LN; bil Lung nodules.   #  [facility] ]Renal cell carcinoma, clear cell type, Furhman grade 2 measuring 6.4. It extended into major veins. Negative margins. 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 [CT chest wall Bx]; Poor risk [anemia;PS;hypercalcemia]; Start RT [10/09/2019]; CT abdomen pelvis-multiple lung nodules; right pelvic lesion [RT-Feb2021]  #Jan 26th 2021-solitary ~1.5 cm lesion left frontal lobe/hemorrhagic/edema brain met s/p SBRT [finished Feb 11th,2021];   # JAN 22nd, 2021-Key+Axi  # 2002- cervical cancer LEEP/conization; no chemo-RT  # dec 2020- Hypercalcemia- Ca- 12; Zometa  # NGS/MOLECULAR TESTS: pending.   # PALLIATIVE CARE EVALUATION: Josh  # PAIN MANAGEMENT: P  DIAGNOSIS: RCC  STAGE:   IV    ;  GOALS: palliative  CURRENT/MOST RECENT THERAPY : key+Axi [C]    Cancer of left kidney ([place])  09/17/2019 Initial Diagnosis   Cancer of left kidney ([place])   10/09/2019 -  Chemotherapy   The patient had pembrolizumab (KEYTRUDA) 200 mg in sodium chloride 0.9 % 50 mL chemo infusion, 200 mg, Intravenous, Once, 4 of 6 cycles Administration: 200 mg (10/09/2019), 200 mg (11/25/2019), 200 mg (12/21/2019), 200 mg (10/30/2019)  for chemotherapy treatment.      ALLERGIES:  has No Known Allergies.  MEDICATIONS:  Current Outpatient Medications  Medication Sig Dispense Refill  . Aspirin-Salicylamide-Caffeine (BC HEADACHE POWDER PO) Take 1 packet by mouth 3 (three) times daily.    Marland Kitchen  axitinib (INLYTA) 5 MG tablet Take 1 tablet (5 mg total) by mouth every 12 (twelve) hours. 60 tablet 4  . chlorpheniramine-HYDROcodone (TUSSIONEX) 10-8 MG/5ML SUER Take 5 mLs by mouth at bedtime as needed for cough. 140 mL 0  . diazepam (VALIUM) 5 MG tablet One pill 45-60 mins prior to procedure; 1 pill 15 mins prior if needed.  4 tablet 0  . lidocaine-prilocaine (EMLA) cream Apply 1 application topically as needed. 30 g 3  . morphine (MS CONTIN) 30 MG 12 hr tablet Take 1 tablet (30 mg total) by mouth every 8 (eight) hours. 60 tablet 0  . ondansetron (ZOFRAN) 8 MG tablet One pill every 8 hours as needed for nausea/vomitting. 40 tablet 1  . Oxycodone HCl 10 MG TABS Take 1 tablet (10 mg total) by mouth every 3 (three) hours as needed (pain). 90 tablet 0  . prochlorperazine (COMPAZINE) 10 MG tablet Take 1 tablet (10 mg total) by mouth every 6 (six) hours as needed for nausea or vomiting. 40 tablet 1  . traZODone (DESYREL) 50 MG tablet Take 1 tablet (50 mg total) by mouth at bedtime as needed for sleep. 30 tablet 2   No current facility-administered medications for this visit.    VITAL SIGNS: LMP  (LMP Unknown)  There were no vitals filed for this visit.  Estimated body mass index is 18.09 kg/m as calculated from the following:   Height as of 12/11/19: '5\' 8"'$  (1.727 m).   Weight as of 01/01/20: 119 lb (54 kg).  LABS: CBC:    Component Value Date/Time   WBC 7.0 12/21/2019 0858   HGB 11.0 (L) 12/21/2019 0858   HCT 35.3 (L) 12/21/2019 0858   PLT 331 12/21/2019 0858   MCV 89.4 12/21/2019 0858   NEUTROABS 5.1 12/21/2019 0858   LYMPHSABS 1.3 12/21/2019 0858   MONOABS 0.6 12/21/2019 0858   EOSABS 0.0 12/21/2019 0858   BASOSABS 0.1 12/21/2019 0858   Comprehensive Metabolic Panel:    Component Value Date/Time   NA 137 12/21/2019 0858   K 4.0 12/21/2019 0858   CL 104 12/21/2019 0858   CO2 23 12/21/2019 0858   BUN 13 12/21/2019 0858   CREATININE 0.63 12/21/2019 0858   GLUCOSE 100 (H) 12/21/2019 0858   CALCIUM 8.8 (L) 12/21/2019 0858   AST 27 12/21/2019 0858   ALT 9 12/21/2019 0858   ALKPHOS 77 12/21/2019 0858   BILITOT 0.5 12/21/2019 0858   PROT 6.8 12/21/2019 0858   ALBUMIN 2.6 (L) 12/21/2019 0858    RADIOGRAPHIC STUDIES: CT Chest W Contrast  Result Date: 01/02/2020 CLINICAL DATA:  Stage IV metastatic  renal cell carcinoma with ongoing immunotherapy. History of left nephrectomy. Restaging. Patient reports worsening left flank and chest wall pain. Additional history of cervical cancer. EXAM: CT CHEST, ABDOMEN, AND PELVIS WITH CONTRAST TECHNIQUE: Multidetector CT imaging of the chest, abdomen and pelvis was performed following the standard protocol during bolus administration of intravenous contrast. CONTRAST:  111m OMNIPAQUE IOHEXOL 300 MG/ML  SOLN COMPARISON:  10/13/2019 CT abdomen/pelvis. 09/16/2019 chest CT angiogram. FINDINGS: CT CHEST FINDINGS Cardiovascular: Normal heart size. No significant pericardial effusion/thickening. Coronary atherosclerosis. Right internal jugular Port-A-Cath terminates at the cavoatrial junction. Atherosclerotic nonaneurysmal thoracic aorta. Normal caliber pulmonary arteries. No central pulmonary emboli. Mediastinum/Nodes: Several hypodense thyroid nodules, largest 1.1 cm on the right, stable. In the setting of significant comorbidities or limited life expectancy, no follow-up recommended (ref: J Am Coll Radiol. 2015 Feb;12(2): 143-50). Unremarkable esophagus. No axillary adenopathy. Mild bilateral supraclavicular adenopathy  is new, largest 1.1 cm on the left (series 2/image 5). Enlarged high right posterior paratracheal 1.9 cm node (series 2/image 10), increased from 1.4 cm. Enlarged 2.2 cm subcarinal node (series 2/image 31), decreased from 2.5 cm. Enlarged 1.2 cm AP window node (series 2/image 25), stable. Stable enlarged 3.1 cm right hilar node (series 2/image 38). Stable left hilar adenopathy up to 1.3 cm (series 2/image 31). Lungs/Pleura: No pneumothorax. No right pleural effusion. Trace dependent left pleural effusion. Posterior left pleural 7.7 x 5.7 cm mass invading the left posterior chest wall with new destruction of portions of the left eleventh and twelfth ribs (series 2/image 48), substantially increased from 5.2 x 2.2 cm. Peripheral basilar 5.9 x 3.0 cm left pleural  mass (series 2/image 64) with destruction of portions of the lateral left eleventh rib, increased from 4.1 x 1.8 cm. Paramediastinal anterior left pleural 4.9 x 3.2 cm mass (series 2/image 24), substantially decreased from 9.3 x 5.8 cm. Innumerable (> than 50) irregular solid pulmonary nodules scattered throughout both lungs, increased. Representative 2.8 cm medial right lower lobe nodule (series 4/image 112), increased from 2.4 cm. Representative 1.8 cm right middle lobe nodule (series 4/image 112), increased from 1.1 cm. Representative 0.7 cm apical right upper lobe nodule (series 4/image 23), increased from 0.4 cm. Musculoskeletal: Expansile lower sternal 6.4 x 3.8 cm mass (series 2/image 38), substantially increased from 2.4 x 1.5 cm. New 2.4 cm lytic posterior T11 vertebral lesion with mild mass-effect on thoracic canal. CT ABDOMEN PELVIS FINDINGS Hepatobiliary: New heterogeneously enhancing 4.3 cm segment 4A left liver mass (series 2/image 55). No additional liver masses. Cholelithiasis. No significant intrahepatic biliary ductal dilatation. Stable mildly dilated CBD (9 mm diameter). Pancreas: Normal, with no mass or duct dilation. Spleen: Normal size. No mass. Adrenals/Urinary Tract: No discrete adrenal nodules. Left nephrectomy. Normal right kidney with no right hydronephrosis. Normal nondistended bladder. Stomach/Bowel: Normal non-distended stomach. Normal caliber small bowel with no small bowel wall thickening. Normal appendix. Apparent wall thickening in the cecum, favored to be due to peristalsis (series 2/image 100). No convincing colonic wall thickening, diverticulosis or significant pericolonic fat stranding. Vascular/Lymphatic: Atherosclerotic nonaneurysmal abdominal aorta. Patent portal, splenic, hepatic and right renal veins. No pathologically enlarged lymph nodes in the abdomen or pelvis. Reproductive: Grossly normal uterus.  No adnexal mass. Other: No pneumoperitoneum, ascites or focal fluid  collection. Musculoskeletal: Right iliac wing expansile 2.8 x 2.3 cm mass (series 2/image 97), mildly decreased from 3.4 x 2.7 cm. Lytic right L1 vertebral lesion is unchanged. No new osseous lesions. Mild lumbar spondylosis. IMPRESSION: 1. Overall progression of metastatic disease, although with some mixed changes as detailed below. 2. Chest wall-invasive posterior lower left pleural metastases have significantly increased with destruction of portions of the left eleventh and twelfth ribs. Previously visualized dominant anterior left pleural metastasis is significantly decreased. 3. Widespread pulmonary metastases, mildly increased particularly on the right. 4. New mild bilateral supraclavicular lymphadenopathy. High right paratracheal adenopathy is increased. Subcarinal adenopathy is decreased. Additional sites of mediastinal and bilateral hilar adenopathy are stable. 5. New solitary left liver lobe metastasis. 6. Widespread osseous metastases, significantly increased in the sternum, new at T11, stable at L1 and decreased in the right iliac wing. 7. Trace dependent left pleural effusion. 8. Cholelithiasis.  Stable mildly dilated CBD. 9. Aortic Atherosclerosis (ICD10-I70.0). Electronically Signed   By: Ilona Sorrel M.D.   On: 01/02/2020 19:44   CT Abdomen Pelvis W Contrast  Result Date: 01/02/2020 CLINICAL DATA:  Stage IV metastatic renal cell  carcinoma with ongoing immunotherapy. History of left nephrectomy. Restaging. Patient reports worsening left flank and chest wall pain. Additional history of cervical cancer. EXAM: CT CHEST, ABDOMEN, AND PELVIS WITH CONTRAST TECHNIQUE: Multidetector CT imaging of the chest, abdomen and pelvis was performed following the standard protocol during bolus administration of intravenous contrast. CONTRAST:  145m OMNIPAQUE IOHEXOL 300 MG/ML  SOLN COMPARISON:  10/13/2019 CT abdomen/pelvis. 09/16/2019 chest CT angiogram. FINDINGS: CT CHEST FINDINGS Cardiovascular: Normal heart  size. No significant pericardial effusion/thickening. Coronary atherosclerosis. Right internal jugular Port-A-Cath terminates at the cavoatrial junction. Atherosclerotic nonaneurysmal thoracic aorta. Normal caliber pulmonary arteries. No central pulmonary emboli. Mediastinum/Nodes: Several hypodense thyroid nodules, largest 1.1 cm on the right, stable. In the setting of significant comorbidities or limited life expectancy, no follow-up recommended (ref: J Am Coll Radiol. 2015 Feb;12(2): 143-50). Unremarkable esophagus. No axillary adenopathy. Mild bilateral supraclavicular adenopathy is new, largest 1.1 cm on the left (series 2/image 5). Enlarged high right posterior paratracheal 1.9 cm node (series 2/image 10), increased from 1.4 cm. Enlarged 2.2 cm subcarinal node (series 2/image 31), decreased from 2.5 cm. Enlarged 1.2 cm AP window node (series 2/image 25), stable. Stable enlarged 3.1 cm right hilar node (series 2/image 38). Stable left hilar adenopathy up to 1.3 cm (series 2/image 31). Lungs/Pleura: No pneumothorax. No right pleural effusion. Trace dependent left pleural effusion. Posterior left pleural 7.7 x 5.7 cm mass invading the left posterior chest wall with new destruction of portions of the left eleventh and twelfth ribs (series 2/image 48), substantially increased from 5.2 x 2.2 cm. Peripheral basilar 5.9 x 3.0 cm left pleural mass (series 2/image 64) with destruction of portions of the lateral left eleventh rib, increased from 4.1 x 1.8 cm. Paramediastinal anterior left pleural 4.9 x 3.2 cm mass (series 2/image 24), substantially decreased from 9.3 x 5.8 cm. Innumerable (> than 50) irregular solid pulmonary nodules scattered throughout both lungs, increased. Representative 2.8 cm medial right lower lobe nodule (series 4/image 112), increased from 2.4 cm. Representative 1.8 cm right middle lobe nodule (series 4/image 112), increased from 1.1 cm. Representative 0.7 cm apical right upper lobe nodule  (series 4/image 23), increased from 0.4 cm. Musculoskeletal: Expansile lower sternal 6.4 x 3.8 cm mass (series 2/image 38), substantially increased from 2.4 x 1.5 cm. New 2.4 cm lytic posterior T11 vertebral lesion with mild mass-effect on thoracic canal. CT ABDOMEN PELVIS FINDINGS Hepatobiliary: New heterogeneously enhancing 4.3 cm segment 4A left liver mass (series 2/image 55). No additional liver masses. Cholelithiasis. No significant intrahepatic biliary ductal dilatation. Stable mildly dilated CBD (9 mm diameter). Pancreas: Normal, with no mass or duct dilation. Spleen: Normal size. No mass. Adrenals/Urinary Tract: No discrete adrenal nodules. Left nephrectomy. Normal right kidney with no right hydronephrosis. Normal nondistended bladder. Stomach/Bowel: Normal non-distended stomach. Normal caliber small bowel with no small bowel wall thickening. Normal appendix. Apparent wall thickening in the cecum, favored to be due to peristalsis (series 2/image 100). No convincing colonic wall thickening, diverticulosis or significant pericolonic fat stranding. Vascular/Lymphatic: Atherosclerotic nonaneurysmal abdominal aorta. Patent portal, splenic, hepatic and right renal veins. No pathologically enlarged lymph nodes in the abdomen or pelvis. Reproductive: Grossly normal uterus.  No adnexal mass. Other: No pneumoperitoneum, ascites or focal fluid collection. Musculoskeletal: Right iliac wing expansile 2.8 x 2.3 cm mass (series 2/image 97), mildly decreased from 3.4 x 2.7 cm. Lytic right L1 vertebral lesion is unchanged. No new osseous lesions. Mild lumbar spondylosis. IMPRESSION: 1. Overall progression of metastatic disease, although with some mixed changes as detailed  below. 2. Chest wall-invasive posterior lower left pleural metastases have significantly increased with destruction of portions of the left eleventh and twelfth ribs. Previously visualized dominant anterior left pleural metastasis is significantly  decreased. 3. Widespread pulmonary metastases, mildly increased particularly on the right. 4. New mild bilateral supraclavicular lymphadenopathy. High right paratracheal adenopathy is increased. Subcarinal adenopathy is decreased. Additional sites of mediastinal and bilateral hilar adenopathy are stable. 5. New solitary left liver lobe metastasis. 6. Widespread osseous metastases, significantly increased in the sternum, new at T11, stable at L1 and decreased in the right iliac wing. 7. Trace dependent left pleural effusion. 8. Cholelithiasis.  Stable mildly dilated CBD. 9. Aortic Atherosclerosis (ICD10-I70.0). Electronically Signed   By: Ilona Sorrel M.D.   On: 01/02/2020 19:44   DG Bone Survey Met  Result Date: 12/22/2019 CLINICAL DATA:  Generalized weakness and mid chest pain for 2 weeks. EXAM: METASTATIC BONE SURVEY COMPARISON:  Chest CT 09/16/2019 FINDINGS: The right IJ power port is stable. The cardiac silhouette, mediastinal and hilar contours are stable. Stable bilateral hilar adenopathy. Diffuse pulmonary metastatic disease. No obvious acute overlying pulmonary process. Remote healed appearing right rib fractures. Ununited distal clavicle fracture. Lytic lesion involving the right iliac bone is noted. There is also a lytic lesion involving the L2 vertebral body. Other bone lesions shown on the prior CT scan are not well seen on plain films. IMPRESSION: 1. Diffuse pulmonary metastatic disease. 2. Lytic metastatic bone lesions. Electronically Signed   By: Marijo Sanes M.D.   On: 12/22/2019 09:01    PERFORMANCE STATUS (ECOG) : 2 - Symptomatic, <50% confined to bed  Review of Systems Unless otherwise noted, a complete review of systems is negative.  Physical Exam General: NAD, frail appearing, thin Pulmonary: Unlabored Extremities: no edema, no joint deformities Skin: no rashes Neurological: Weakness but otherwise nonfocal  IMPRESSION: Dr. Rogue Bussing and I met with patient and caregiver,  Velna Hatchet.  Together, we reviewed patient's CT results.  In light of significant disease progression on chemotherapy, future treatment options are thought to be limited.  We discussed alternative of pursuing hospice at home and patient was in agreement.  She would like to focus on comfort and quality of life.  Will cancel RT.  Patient has had rapidly declining performance status, poor oral intake, weight loss, and severe pain.  Patient says that she is only taking the MS Contin twice daily.  She is in agreement to increasing to every 8 hour dosing.  We will also increase oxycodone to 10 to 15 mg every 3 hours as needed for breakthrough pain.    She says that she took her friend's gabapentin 600 mg last night and slept better than she has previously on trazodone.  She requests a prescription for gabapentin.  We will also start dexamethasone daily for appetite, fatigue, and pain.  I suspect that there is a capsular component given her liver mets.   PLAN: -Hospice referral at home -Increase MS Contin 30 mg every 8 hours (#60) -Increase oxycodone IR 10-15 mg every 3 hours as needed for breakthrough pain -DC trazodone -Gabapentin 300-600 mg nightly as needed -Dexamethasone 2 mg daily -DNR/DNI -RTC as needed   Patient expressed understanding and was in agreement with this plan. She also understands that She can call the clinic at any time with any questions, concerns, or complaints.     Time Total: 30 minutes  Visit consisted of counseling and education dealing with the complex and emotionally intense issues of symptom management and  palliative care in the setting of serious and potentially life-threatening illness.Greater than 50%  of this time was spent counseling and coordinating care related to the above assessment and plan.  Signed by: Altha Harm, PhD, NP-C

## 2020-01-07 NOTE — Progress Notes (Unsigned)
Patient here today for follow up. Patient reports decreased appetite, nausea, vomiting at times. She reports pain in both sides, abdomen and lower back.

## 2020-01-07 NOTE — Progress Notes (Unsigned)
[name] CONSULT NOTE  Patient Care Team: Patient, No Pcp Per as PCP - General (General Practice) Noreene Filbert, MD as Radiation Oncologist (Radiation Oncology) Cammie Sickle, MD as Consulting Physician (Internal Medicine) Borders, Kirt Boys, NP as Nurse Practitioner (Hospice and Palliative Medicine)  CHIEF COMPLAINTS/PURPOSE OF CONSULTATION: Lung mass/history of kidney cancer#   Oncology History Overview Note  # DEC 2020- left chest wall mass; bil mediatisnal LN; bil Lung nodules.   #  2016[facility] ]Renal cell carcinoma, clear cell type, Furhman grade 2 measuring 6.4. It extended into major veins. Negative margins. 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 [CT chest wall Bx]; Poor risk [anemia;PS;hypercalcemia]; Start RT [10/09/2019]; CT abdomen pelvis-multiple lung nodules; right pelvic lesion [RT-Feb2021]  #Jan 26th 2021-solitary ~1.5 cm lesion left frontal lobe/hemorrhagic/edema brain met s/p SBRT [finished Feb 11th,2021];   # JAN 22nd, 2021-Key+Axi  # 2002- cervical cancer LEEP/conization; no chemo-RT  # dec 2020- Hypercalcemia- Ca- 12; Zometa  # NGS/MOLECULAR TESTS: pending.   # PALLIATIVE CARE EVALUATION: Josh  # PAIN MANAGEMENT: P  DIAGNOSIS: RCC  STAGE:   IV    ;  GOALS: palliative  CURRENT/MOST RECENT THERAPY : key+Axi [C]    Cancer of left kidney ([place])  09/17/2019 Initial Diagnosis   Cancer of left kidney ([place])   10/09/2019 -  Chemotherapy   The patient had pembrolizumab (KEYTRUDA) 200 mg in sodium chloride 0.9 % 50 mL chemo infusion, 200 mg, Intravenous, Once, 4 of 6 cycles Administration: 200 mg (10/09/2019), 200 mg (11/25/2019), 200 mg (12/21/2019), 200 mg (10/30/2019)  for chemotherapy treatment.       HISTORY OF PRESENTING ILLNESS:  Angelica Black 49 y.o.  female stage IV carcinoma of the kidney currently on Keytruda plus axitinib is here for follow-up.  Patient did not  get her CT scans as ordered last week; states that she was confused about the dates.  Patient went to the emergency room for further evaluation of worsening pain.;  However she did not stay to be evaluated because of the wait times.  Patient complains of pain in her left upper quadrant/swelling over the last 2 weeks.  Progressively worse.  She had been taking her Percocet more often than recommended.  She is also MS Contin.  Appetite is poor.  Weight loss.   Review of Systems  Constitutional: Positive for weight loss. Negative for chills, diaphoresis, fever and malaise/fatigue.  HENT: Negative for nosebleeds and sore throat.   Eyes: Negative for double vision.  Respiratory: Positive for cough and shortness of breath. Negative for hemoptysis, sputum production and wheezing.   Cardiovascular: Positive for chest pain. Negative for palpitations, orthopnea and leg swelling.  Gastrointestinal: Negative for abdominal pain, blood in stool, constipation, diarrhea, heartburn, melena, nausea and vomiting.  Genitourinary: Negative for dysuria, frequency and urgency.  Musculoskeletal: Positive for back pain and joint pain.  Skin: Negative.  Negative for itching and rash.  Neurological: Positive for tingling. Negative for dizziness, focal weakness, weakness and headaches.  Endo/Heme/Allergies: Does not bruise/bleed easily.  Psychiatric/Behavioral: Negative for depression. The patient is not nervous/anxious and does not have insomnia.      MEDICAL HISTORY:  Past Medical History:  Diagnosis Date  . Cancer associated pain   . Cervical cancer ([place])   . Chronic cough   . Renal cell carcinoma ([place]) 2016   Pathologic stage from 07/08/2015: Stage Unknown (T3a, NX, cM0) - Signed by Janeth Rase  Idolina Primer, NP on 11/25/2015    SURGICAL HISTORY: Past Surgical History:  Procedure Laterality Date  . CERVICAL BIOPSY  W/ LOOP ELECTRODE EXCISION    . IR IMAGING GUIDED PORT INSERTION  10/05/2019  . PARTIAL  NEPHRECTOMY Left 07/08/2015   robotic laparoscopic nephrectomy at [place]: Social History   Socioeconomic History  . Marital status: Divorced    Spouse name: Not on file  . Number of children: Not on file  . Years of education: Not on file  . Highest education level: Not on file  Occupational History  . Not on file  Tobacco Use  . Smoking status: Current Every Day Smoker    Packs/day: 1.00    Years: 35.00    Pack years: 35.00    Types: Cigarettes  . Smokeless tobacco: Never Used  Substance and Sexual Activity  . Alcohol use: Yes    Comment: ocassional   . Drug use: Not on file  . Sexual activity: Not on file  Other Topics Concern  . 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 Difficulty of Paying Living Expenses:   Food Insecurity:   . Worried About Charity fundraiser in the Last Year:   . Arboriculturist in the Last Year:   Transportation Needs:   . Film/video editor (Medical):   Marland Kitchen Lack of Transportation (Non-Medical):   Physical Activity:   . Days of Exercise per Week:   . Minutes of Exercise per Session:   Stress:   . Feeling of Stress :   Social Connections:   . Frequency of Communication with Friends and Family:   . Frequency of Social Gatherings with Friends and Family:   . Attends Religious Services:   . Active Member of Clubs or Organizations:   . Attends Archivist Meetings:   Marland Kitchen Marital Status:   Intimate Partner Violence:   . Fear of Current or Ex-Partner:   . Emotionally Abused:   Marland Kitchen Physically Abused:   . Sexually Abused:     FAMILY HISTORY: Family History  Problem Relation Age of Onset  . Hypertension Father     ALLERGIES:  has No Known Allergies.  MEDICATIONS:  Current Outpatient Medications   Medication Sig Dispense Refill  . Aspirin-Salicylamide-Caffeine (BC HEADACHE POWDER PO) Take 1 packet by mouth 3 (three) times daily.    Marland Kitchen axitinib (INLYTA) 5 MG tablet Take 1 tablet (5 mg total) by mouth every 12 (twelve) hours. 60 tablet 4  . chlorpheniramine-HYDROcodone (TUSSIONEX) 10-8 MG/5ML SUER Take 5 mLs by mouth at bedtime as needed for cough. 140 mL 0  . lidocaine-prilocaine (EMLA) cream Apply 1 application topically as needed. 30 g 3  . morphine (MS CONTIN) 30 MG 12 hr tablet Take 1 tablet (30 mg total) by mouth every 8 (eight) hours. 60 tablet 0  . ondansetron (ZOFRAN) 8 MG tablet One pill every 8 hours as needed for nausea/vomitting. 40 tablet 1  . Oxycodone HCl 10 MG TABS Take 1 tablet (10 mg total) by mouth every 3 (three) hours as needed (pain). 90 tablet 0  . prochlorperazine (COMPAZINE) 10 MG tablet Take 1 tablet (10 mg total) by mouth every 6 (six) hours as needed for nausea or vomiting. 40 tablet  1  . traZODone (DESYREL) 50 MG tablet Take 1 tablet (50 mg total) by mouth at bedtime as needed for sleep. 30 tablet 2  . diazepam (VALIUM) 5 MG tablet One pill 45-60 mins prior to procedure; 1 pill 15 mins prior if needed. (Patient not taking: Reported on 01/07/2020) 4 tablet 0   No current facility-administered medications for this visit.      Marland Kitchen  PHYSICAL EXAMINATION: ECOG PERFORMANCE STATUS: 1 - Symptomatic but completely ambulatory  Vitals:   01/07/20 1413  BP: 100/70  Pulse: (!) 130  Resp: 20  SpO2: 90%   There were no vitals filed for this visit.  Physical Exam  Constitutional: She is oriented to person, place, and time and well-developed, well-nourished, and in no distress.  Thin built female patient.  She is accompanied by her friend.  In a wheelchair.  HENT:  Head: Normocephalic and atraumatic.  Mouth/Throat: Oropharynx is clear and moist. No oropharyngeal exudate.  Eyes: Pupils are equal, round, and reactive to light.  Cardiovascular: Normal rate and  regular rhythm.  Pulmonary/Chest: No respiratory distress. She has no wheezes.  Decreased air entry bilaterally.  Abdominal: Soft. Bowel sounds are normal. She exhibits no distension and no mass. There is no abdominal tenderness. There is no rebound and no guarding.  Left flank/left lower quadrant vague 4 to 5 cm bulge noted tender to touch.  Musculoskeletal:        General: No tenderness or edema. Normal range of motion.     Cervical back: Normal range of motion and neck supple.  Neurological: She is alert and oriented to person, place, and time.  Skin: Skin is warm.  Vague mass noted in the left chest wall region.  Tender to touch.  Psychiatric: Affect normal.  Anxious.    LABORATORY DATA:  I have reviewed the data as listed Lab Results  Component Value Date   WBC 7.0 12/21/2019   HGB 11.0 (L) 12/21/2019   HCT 35.3 (L) 12/21/2019   MCV 89.4 12/21/2019   PLT 331 12/21/2019   Recent Labs    10/30/19 1324 10/30/19 1324 11/25/19 1325 12/11/19 1348 12/21/19 0858  NA 135   < > 132* 137 137  K 4.3   < > 4.7 3.8 4.0  CL 102   < > 101 102 104  CO2 24   < > 20* 24 23  GLUCOSE 151*   < > 135* 101* 100*  BUN 26*   < > 22* 13 13  CREATININE 0.58   < > 0.49 0.55 0.63  CALCIUM 8.3*   < > 8.5* 8.8* 8.8*  GFRNONAA >60   < > >60 >60 >60  GFRAA >60   < > >60 >60 >60  PROT 6.6  --  6.7  --  6.8  ALBUMIN 3.1*  --  3.1*  --  2.6*  AST 20  --  25  --  27  ALT 15  --  16  --  9  ALKPHOS 82  --  74  --  77  BILITOT 0.4  --  0.4  --  0.5   < > = values in this interval not displayed.    RADIOGRAPHIC STUDIES: I have personally reviewed the radiological images as listed and agreed with the findings in the report. CT Chest W Contrast  Result Date: 01/02/2020 CLINICAL DATA:  Stage IV metastatic renal cell carcinoma with ongoing immunotherapy. History of left nephrectomy. Restaging. Patient reports worsening left flank and chest wall  pain. Additional history of cervical cancer. EXAM: CT  CHEST, ABDOMEN, AND PELVIS WITH CONTRAST TECHNIQUE: Multidetector CT imaging of the chest, abdomen and pelvis was performed following the standard protocol during bolus administration of intravenous contrast. CONTRAST:  180m OMNIPAQUE IOHEXOL 300 MG/ML  SOLN COMPARISON:  10/13/2019 CT abdomen/pelvis. 09/16/2019 chest CT angiogram. FINDINGS: CT CHEST FINDINGS Cardiovascular: Normal heart size. No significant pericardial effusion/thickening. Coronary atherosclerosis. Right internal jugular Port-A-Cath terminates at the cavoatrial junction. Atherosclerotic nonaneurysmal thoracic aorta. Normal caliber pulmonary arteries. No central pulmonary emboli. Mediastinum/Nodes: Several hypodense thyroid nodules, largest 1.1 cm on the right, stable. In the setting of significant comorbidities or limited life expectancy, no follow-up recommended (ref: J Am Coll Radiol. 2015 Feb;12(2): 143-50). Unremarkable esophagus. No axillary adenopathy. Mild bilateral supraclavicular adenopathy is new, largest 1.1 cm on the left (series 2/image 5). Enlarged high right posterior paratracheal 1.9 cm node (series 2/image 10), increased from 1.4 cm. Enlarged 2.2 cm subcarinal node (series 2/image 31), decreased from 2.5 cm. Enlarged 1.2 cm AP window node (series 2/image 25), stable. Stable enlarged 3.1 cm right hilar node (series 2/image 38). Stable left hilar adenopathy up to 1.3 cm (series 2/image 31). Lungs/Pleura: No pneumothorax. No right pleural effusion. Trace dependent left pleural effusion. Posterior left pleural 7.7 x 5.7 cm mass invading the left posterior chest wall with new destruction of portions of the left eleventh and twelfth ribs (series 2/image 48), substantially increased from 5.2 x 2.2 cm. Peripheral basilar 5.9 x 3.0 cm left pleural mass (series 2/image 64) with destruction of portions of the lateral left eleventh rib, increased from 4.1 x 1.8 cm. Paramediastinal anterior left pleural 4.9 x 3.2 cm mass (series 2/image 24),  substantially decreased from 9.3 x 5.8 cm. Innumerable (> than 50) irregular solid pulmonary nodules scattered throughout both lungs, increased. Representative 2.8 cm medial right lower lobe nodule (series 4/image 112), increased from 2.4 cm. Representative 1.8 cm right middle lobe nodule (series 4/image 112), increased from 1.1 cm. Representative 0.7 cm apical right upper lobe nodule (series 4/image 23), increased from 0.4 cm. Musculoskeletal: Expansile lower sternal 6.4 x 3.8 cm mass (series 2/image 38), substantially increased from 2.4 x 1.5 cm. New 2.4 cm lytic posterior T11 vertebral lesion with mild mass-effect on thoracic canal. CT ABDOMEN PELVIS FINDINGS Hepatobiliary: New heterogeneously enhancing 4.3 cm segment 4A left liver mass (series 2/image 55). No additional liver masses. Cholelithiasis. No significant intrahepatic biliary ductal dilatation. Stable mildly dilated CBD (9 mm diameter). Pancreas: Normal, with no mass or duct dilation. Spleen: Normal size. No mass. Adrenals/Urinary Tract: No discrete adrenal nodules. Left nephrectomy. Normal right kidney with no right hydronephrosis. Normal nondistended bladder. Stomach/Bowel: Normal non-distended stomach. Normal caliber small bowel with no small bowel wall thickening. Normal appendix. Apparent wall thickening in the cecum, favored to be due to peristalsis (series 2/image 100). No convincing colonic wall thickening, diverticulosis or significant pericolonic fat stranding. Vascular/Lymphatic: Atherosclerotic nonaneurysmal abdominal aorta. Patent portal, splenic, hepatic and right renal veins. No pathologically enlarged lymph nodes in the abdomen or pelvis. Reproductive: Grossly normal uterus.  No adnexal mass. Other: No pneumoperitoneum, ascites or focal fluid collection. Musculoskeletal: Right iliac wing expansile 2.8 x 2.3 cm mass (series 2/image 97), mildly decreased from 3.4 x 2.7 cm. Lytic right L1 vertebral lesion is unchanged. No new osseous  lesions. Mild lumbar spondylosis. IMPRESSION: 1. Overall progression of metastatic disease, although with some mixed changes as detailed below. 2. Chest wall-invasive posterior lower left pleural metastases have significantly increased with destruction of portions  of the left eleventh and twelfth ribs. Previously visualized dominant anterior left pleural metastasis is significantly decreased. 3. Widespread pulmonary metastases, mildly increased particularly on the right. 4. New mild bilateral supraclavicular lymphadenopathy. High right paratracheal adenopathy is increased. Subcarinal adenopathy is decreased. Additional sites of mediastinal and bilateral hilar adenopathy are stable. 5. New solitary left liver lobe metastasis. 6. Widespread osseous metastases, significantly increased in the sternum, new at T11, stable at L1 and decreased in the right iliac wing. 7. Trace dependent left pleural effusion. 8. Cholelithiasis.  Stable mildly dilated CBD. 9. Aortic Atherosclerosis (ICD10-I70.0). Electronically Signed   By: Ilona Sorrel M.D.   On: 01/02/2020 19:44   CT Abdomen Pelvis W Contrast  Result Date: 01/02/2020 CLINICAL DATA:  Stage IV metastatic renal cell carcinoma with ongoing immunotherapy. History of left nephrectomy. Restaging. Patient reports worsening left flank and chest wall pain. Additional history of cervical cancer. EXAM: CT CHEST, ABDOMEN, AND PELVIS WITH CONTRAST TECHNIQUE: Multidetector CT imaging of the chest, abdomen and pelvis was performed following the standard protocol during bolus administration of intravenous contrast. CONTRAST:  182m OMNIPAQUE IOHEXOL 300 MG/ML  SOLN COMPARISON:  10/13/2019 CT abdomen/pelvis. 09/16/2019 chest CT angiogram. FINDINGS: CT CHEST FINDINGS Cardiovascular: Normal heart size. No significant pericardial effusion/thickening. Coronary atherosclerosis. Right internal jugular Port-A-Cath terminates at the cavoatrial junction. Atherosclerotic nonaneurysmal thoracic  aorta. Normal caliber pulmonary arteries. No central pulmonary emboli. Mediastinum/Nodes: Several hypodense thyroid nodules, largest 1.1 cm on the right, stable. In the setting of significant comorbidities or limited life expectancy, no follow-up recommended (ref: J Am Coll Radiol. 2015 Feb;12(2): 143-50). Unremarkable esophagus. No axillary adenopathy. Mild bilateral supraclavicular adenopathy is new, largest 1.1 cm on the left (series 2/image 5). Enlarged high right posterior paratracheal 1.9 cm node (series 2/image 10), increased from 1.4 cm. Enlarged 2.2 cm subcarinal node (series 2/image 31), decreased from 2.5 cm. Enlarged 1.2 cm AP window node (series 2/image 25), stable. Stable enlarged 3.1 cm right hilar node (series 2/image 38). Stable left hilar adenopathy up to 1.3 cm (series 2/image 31). Lungs/Pleura: No pneumothorax. No right pleural effusion. Trace dependent left pleural effusion. Posterior left pleural 7.7 x 5.7 cm mass invading the left posterior chest wall with new destruction of portions of the left eleventh and twelfth ribs (series 2/image 48), substantially increased from 5.2 x 2.2 cm. Peripheral basilar 5.9 x 3.0 cm left pleural mass (series 2/image 64) with destruction of portions of the lateral left eleventh rib, increased from 4.1 x 1.8 cm. Paramediastinal anterior left pleural 4.9 x 3.2 cm mass (series 2/image 24), substantially decreased from 9.3 x 5.8 cm. Innumerable (> than 50) irregular solid pulmonary nodules scattered throughout both lungs, increased. Representative 2.8 cm medial right lower lobe nodule (series 4/image 112), increased from 2.4 cm. Representative 1.8 cm right middle lobe nodule (series 4/image 112), increased from 1.1 cm. Representative 0.7 cm apical right upper lobe nodule (series 4/image 23), increased from 0.4 cm. Musculoskeletal: Expansile lower sternal 6.4 x 3.8 cm mass (series 2/image 38), substantially increased from 2.4 x 1.5 cm. New 2.4 cm lytic posterior T11  vertebral lesion with mild mass-effect on thoracic canal. CT ABDOMEN PELVIS FINDINGS Hepatobiliary: New heterogeneously enhancing 4.3 cm segment 4A left liver mass (series 2/image 55). No additional liver masses. Cholelithiasis. No significant intrahepatic biliary ductal dilatation. Stable mildly dilated CBD (9 mm diameter). Pancreas: Normal, with no mass or duct dilation. Spleen: Normal size. No mass. Adrenals/Urinary Tract: No discrete adrenal nodules. Left nephrectomy. Normal right kidney with no right hydronephrosis.  Normal nondistended bladder. Stomach/Bowel: Normal non-distended stomach. Normal caliber small bowel with no small bowel wall thickening. Normal appendix. Apparent wall thickening in the cecum, favored to be due to peristalsis (series 2/image 100). No convincing colonic wall thickening, diverticulosis or significant pericolonic fat stranding. Vascular/Lymphatic: Atherosclerotic nonaneurysmal abdominal aorta. Patent portal, splenic, hepatic and right renal veins. No pathologically enlarged lymph nodes in the abdomen or pelvis. Reproductive: Grossly normal uterus.  No adnexal mass. Other: No pneumoperitoneum, ascites or focal fluid collection. Musculoskeletal: Right iliac wing expansile 2.8 x 2.3 cm mass (series 2/image 97), mildly decreased from 3.4 x 2.7 cm. Lytic right L1 vertebral lesion is unchanged. No new osseous lesions. Mild lumbar spondylosis. IMPRESSION: 1. Overall progression of metastatic disease, although with some mixed changes as detailed below. 2. Chest wall-invasive posterior lower left pleural metastases have significantly increased with destruction of portions of the left eleventh and twelfth ribs. Previously visualized dominant anterior left pleural metastasis is significantly decreased. 3. Widespread pulmonary metastases, mildly increased particularly on the right. 4. New mild bilateral supraclavicular lymphadenopathy. High right paratracheal adenopathy is increased. Subcarinal  adenopathy is decreased. Additional sites of mediastinal and bilateral hilar adenopathy are stable. 5. New solitary left liver lobe metastasis. 6. Widespread osseous metastases, significantly increased in the sternum, new at T11, stable at L1 and decreased in the right iliac wing. 7. Trace dependent left pleural effusion. 8. Cholelithiasis.  Stable mildly dilated CBD. 9. Aortic Atherosclerosis (ICD10-I70.0). Electronically Signed   By: Ilona Sorrel M.D.   On: 01/02/2020 19:44   DG Bone Survey Met  Result Date: 12/22/2019 CLINICAL DATA:  Generalized weakness and mid chest pain for 2 weeks. EXAM: METASTATIC BONE SURVEY COMPARISON:  Chest CT 09/16/2019 FINDINGS: The right IJ power port is stable. The cardiac silhouette, mediastinal and hilar contours are stable. Stable bilateral hilar adenopathy. Diffuse pulmonary metastatic disease. No obvious acute overlying pulmonary process. Remote healed appearing right rib fractures. Ununited distal clavicle fracture. Lytic lesion involving the right iliac bone is noted. There is also a lytic lesion involving the L2 vertebral body. Other bone lesions shown on the prior CT scan are not well seen on plain films. IMPRESSION: 1. Diffuse pulmonary metastatic disease. 2. Lytic metastatic bone lesions. Electronically Signed   By: Marijo Sanes M.D.   On: 12/22/2019 09:01    ASSESSMENT & PLAN:   No problem-specific Assessment & Plan notes found for this encounter.  All questions were answered. The patient knows to call the clinic with any problems, questions or concerns.    Cammie Sickle, MD 01/07/2020 2:21 PM

## 2020-01-07 NOTE — Assessment & Plan Note (Signed)
#   Metastatic renal cell carcinoma-stage IV [lung nodules chest wall mass]; poor risk.  Currently on Keytruda plus axitinib.  S/p 3 cycles-patient worsening left flank pain/chest wall pain-question progression.  Recommend CT scan chest and pelvis ASAP.  Again reminded to get skeletal survey today.  # proceed with keytruda #4. today. Labs today reviewed;  acceptable for treatment today-mild sinus tachycardia likely secondary to pain [see below]  # Brain MRI-solitary brain metastasis- s/p SBRT [2/11]; stable.  Will recommend MRI after above work-up is available  # Left chest wall pain-likely malignancy; s/p chest wall radiation; hip radiation.poorly controlled on MS Contin to 30 mg 3 times a day; Percocet 10/325.  Will defer to [facility] for pain management.  CT scans planned as above  # Hypocalcemia [hypercalcemia at presentation]- s/p Zometa; calcium 8.8 hold Zometa today.  Will resume next week  #Weight loss-likely secondary to underlying malignancy; again await scans as above  # DISPOSITION: # Keytruda today # follow up-3 weeks MD; labs- cbc/cmp; Keytruda; Zometa Dr.B

## 2020-01-07 NOTE — Telephone Encounter (Signed)
[facility] [place] - please cancel chemo infusion on Monday 4/26. Reschedule patient's apts to 4/26 - lab/md.  Usually patient prefers apts in pm after lunch. Caregiver works 3rd shift.  Thanks.Nira Conn

## 2020-01-07 NOTE — Telephone Encounter (Signed)
On 4/21-spoke to patient's caregiver regarding progression on CT scan.  Patient poor systemic candidate for therapy moving forward.  Patient to proceed with palliative care evaluation today.  Recommend continue radiation for pain control  Given patient/caregiver preference-move the 4/26- MD/lab appointment to 4/27. Cancel chemo infusion.

## 2020-01-10 ENCOUNTER — Encounter: Payer: Self-pay | Admitting: Internal Medicine

## 2020-01-11 ENCOUNTER — Inpatient Hospital Stay: Payer: Self-pay

## 2020-01-11 ENCOUNTER — Ambulatory Visit: Payer: Self-pay

## 2020-01-11 ENCOUNTER — Inpatient Hospital Stay: Payer: Self-pay | Admitting: Internal Medicine

## 2020-01-11 ENCOUNTER — Inpatient Hospital Stay: Payer: Self-pay | Admitting: Hospice and Palliative Medicine

## 2020-01-11 ENCOUNTER — Other Ambulatory Visit: Payer: Self-pay | Admitting: *Deleted

## 2020-01-11 DIAGNOSIS — C642 Malignant neoplasm of left kidney, except renal pelvis: Secondary | ICD-10-CM

## 2020-01-11 DIAGNOSIS — R053 Chronic cough: Secondary | ICD-10-CM

## 2020-01-11 DIAGNOSIS — R05 Cough: Secondary | ICD-10-CM

## 2020-01-11 MED ORDER — HYDROCOD POLST-CPM POLST ER 10-8 MG/5ML PO SUER: 5.0000 mL | Freq: Every evening | ORAL | 0 refills | Status: AC | PRN

## 2020-01-12 ENCOUNTER — Ambulatory Visit: Payer: Self-pay

## 2020-01-12 ENCOUNTER — Inpatient Hospital Stay: Payer: Self-pay

## 2020-01-12 ENCOUNTER — Inpatient Hospital Stay: Payer: Self-pay | Admitting: Internal Medicine

## 2020-01-13 ENCOUNTER — Ambulatory Visit: Payer: Self-pay

## 2020-01-14 ENCOUNTER — Ambulatory Visit: Payer: Self-pay

## 2020-01-15 ENCOUNTER — Ambulatory Visit: Payer: Self-pay

## 2020-01-15 ENCOUNTER — Telehealth: Payer: Self-pay | Admitting: Hospice and Palliative Medicine

## 2020-01-15 NOTE — Telephone Encounter (Signed)
I spoke with patient's hospice nurse.  Patient having persistent, severe, generalized pain, which she rates as 10 out of 10.  She is currently taking MS Contin 30 mg every 8 hours and oxycodone 10 mg every 3-4 hours around-the-clock.  Discussed liberalizing use of oxycodone 1 to 2 tablets every 3 hours as needed for breakthrough pain.  Nurse will also call in lorazepam 0.5 mg every 4 hours as needed for anxiety.  Would consider increasing long-acting opioid if pain remains poorly controlled.

## 2020-01-18 ENCOUNTER — Other Ambulatory Visit: Payer: Self-pay | Admitting: Hospice and Palliative Medicine

## 2020-01-18 ENCOUNTER — Ambulatory Visit: Payer: Self-pay

## 2020-01-18 ENCOUNTER — Telehealth: Payer: Self-pay | Admitting: *Deleted

## 2020-01-18 ENCOUNTER — Inpatient Hospital Stay: Payer: Medicaid Other

## 2020-01-18 MED ORDER — OXYCODONE HCL 10 MG PO TABS: 10.0000 mg | ORAL_TABLET | ORAL | 0 refills | Status: DC | PRN

## 2020-01-18 MED ORDER — HALOPERIDOL 0.5 MG PO TABS: 0.5000 mg | ORAL_TABLET | Freq: Four times a day (QID) | ORAL | 0 refills | Status: AC | PRN

## 2020-01-18 NOTE — Telephone Encounter (Signed)
oni with hospice called reporting that the Lorazepam is making patient hallucinate and that she needs something different ordered. Please call her at [phone]

## 2020-01-18 NOTE — Progress Notes (Signed)
Spoke with patient's hospice nurse.  Patient had delirium on lorazepam with auditory/visual hallucinations.  Patient has anxiety/agitation.  Will discontinue lorazepam and start Haldol p.o. 0.5 mg every 6 hours as needed.

## 2020-01-18 NOTE — Telephone Encounter (Signed)
Discussed with hospice nurse. Rotated to oral haldol. Oxycodone refill was also requested. PDMP reviewed.

## 2020-01-19 ENCOUNTER — Ambulatory Visit: Payer: Self-pay

## 2020-01-20 ENCOUNTER — Other Ambulatory Visit: Payer: Self-pay | Admitting: Hospice and Palliative Medicine

## 2020-01-20 ENCOUNTER — Ambulatory Visit: Payer: Self-pay

## 2020-01-20 ENCOUNTER — Telehealth: Payer: Self-pay | Admitting: *Deleted

## 2020-01-20 MED ORDER — OXYCODONE HCL 10 MG PO TABS: 10.0000 mg | ORAL_TABLET | ORAL | 0 refills | Status: DC | PRN

## 2020-01-20 MED ORDER — MORPHINE SULFATE ER 30 MG PO TBCR: EXTENDED_RELEASE_TABLET | ORAL | 0 refills | Status: DC

## 2020-01-20 MED ORDER — MORPHINE SULFATE ER 15 MG PO TBCR: EXTENDED_RELEASE_TABLET | ORAL | 0 refills | Status: AC

## 2020-01-20 NOTE — Progress Notes (Signed)
I spoke with patient's hospice nurse.  Reportedly, patient does have severe and persistent chest wall pain.  She has been taking oxycodone about every 3 hours around-the-clock.  Reportedly, one tablet of oxycodone will lower pain to 7 out of 10 from 10 out of 10.  Two tablets of oxycodone will lower pain to 5 out of 10.  Okay to continue 1 to 2 tablets of oxycodone as needed for breakthrough pain.  However, we will also increase MS Contin to 45 mg (15 mg +30 mg tablets) every 8 hours.

## 2020-01-20 NOTE — Telephone Encounter (Signed)
Voicemail left for hospice nurse

## 2020-01-20 NOTE — Telephone Encounter (Signed)
Vivien Rota, hospice nurse called and reports that patient is having uncontrolled pain. She is currently taking 2 Oxycodone 10 mg tabs every 3 hrs and it only takes her from a pain rating of 10 to a 7. She is on MS ER 30 mg every 8 hours as well. Please advise

## 2020-01-21 ENCOUNTER — Ambulatory Visit: Payer: Self-pay

## 2020-01-22 ENCOUNTER — Ambulatory Visit: Payer: Self-pay

## 2020-01-25 ENCOUNTER — Ambulatory Visit: Payer: Self-pay

## 2020-01-26 ENCOUNTER — Ambulatory Visit: Payer: Self-pay

## 2020-01-27 ENCOUNTER — Ambulatory Visit: Payer: Self-pay

## 2020-01-29 ENCOUNTER — Other Ambulatory Visit: Payer: Self-pay | Admitting: *Deleted

## 2020-01-29 ENCOUNTER — Telehealth: Payer: Self-pay | Admitting: Hospice and Palliative Medicine

## 2020-01-29 MED ORDER — OXYCODONE HCL 10 MG PO TABS: 10.0000 mg | ORAL_TABLET | ORAL | 0 refills | Status: DC | PRN

## 2020-01-29 MED ORDER — OXYCODONE HCL 10 MG PO TABS: 10.0000 mg | ORAL_TABLET | ORAL | 0 refills | Status: AC | PRN

## 2020-01-29 NOTE — Telephone Encounter (Signed)
Angelica Black requesting refill for 15 day supply as she is taking 2 tabs every 3 hours (=240 tabs)

## 2020-01-29 NOTE — Telephone Encounter (Signed)
I spoke with patient's hospice nurse.  Reportedly, caregiver is giving the patient 2 oxycodone every 3 hours regardless of if the patient is in pain or not.  She is instructed the caregiver to only use oxycodone as needed for breakthrough pain.  I do not feel comfortable giving 240 tablets.  We could increase the dose of the MS Contin if patient is truly requiring frequent oxycodone versus increasing to 20 mg dose.  However, will refill #90 tablets today and hospice nurse to do a pill count and education next week with patient/caregiver.  We can then adjust the medication regimen as needed.

## 2020-01-29 NOTE — Telephone Encounter (Signed)
I spoke with patient's hospice nurse.  Reportedly, caregiver is giving the patient 2 oxycodone every 3 hours regardless of if the patient is in pain or not.  She has instructed the caregiver to only use oxycodone as needed for breakthrough pain.  We could increase the dose of the MS Contin if patient is truly requiring frequent oxycodone versus increasing to 20 mg dose.  However, will refill #90 tablets today and hospice nurse to do a pill count and education next week with patient/caregiver.  We can then adjust the medication regimen as needed.

## 2020-02-02 ENCOUNTER — Other Ambulatory Visit: Payer: Self-pay | Admitting: *Deleted

## 2020-02-02 MED ORDER — MORPHINE SULFATE ER 30 MG PO TBCR: EXTENDED_RELEASE_TABLET | ORAL | 0 refills | Status: AC

## 2020-02-02 MED ORDER — MORPHINE SULFATE (CONCENTRATE) 20 MG/ML PO SOLN: ORAL | 0 refills | Status: AC

## 2020-02-16 DEATH — deceased

## 2021-01-01 IMAGING — CT CT ABD-PELV W/ CM
3 of 5 series · 12 of 36 positions shown, 14 images · IV contrast (Omnipaque or Isovue)
Comparison: 10/13/2019 CT abdomen/pelvis. 09/16/2019 chest CT
angiogram.

CLINICAL DATA: Stage IV metastatic renal cell carcinoma with
ongoing immunotherapy. History of left nephrectomy. Restaging.
Patient reports worsening left flank and chest wall pain. Additional
history of cervical cancer.

EXAM:
CT CHEST, ABDOMEN, AND PELVIS WITH CONTRAST
TECHNIQUE: Multidetector CT imaging of the chest, abdomen and pelvis was
performed following the standard protocol during bolus
administration of intravenous contrast.
CONTRAST:  100mL OMNIPAQUE IOHEXOL 300 MG/ML  SOLN

[Series 2: cap with · axial · 0.73mm/px · z∈[+758,+1253]mm · 8 of 129 slices shown, 10 images]
[im 15/129  mediastinal]
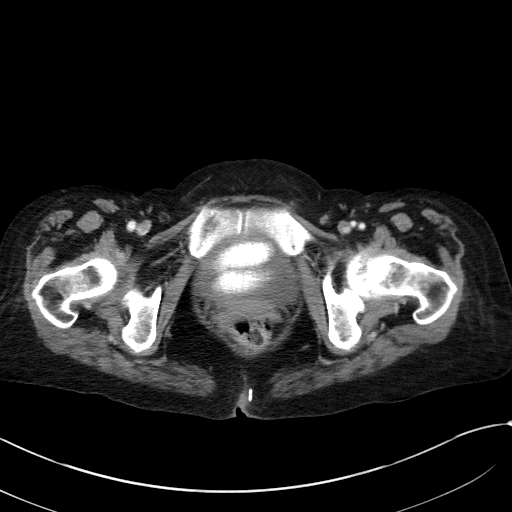
[im 15/129  lung]
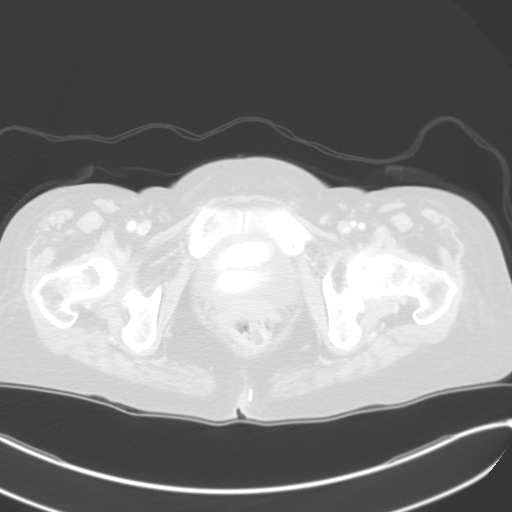
[im 29/129  lung]
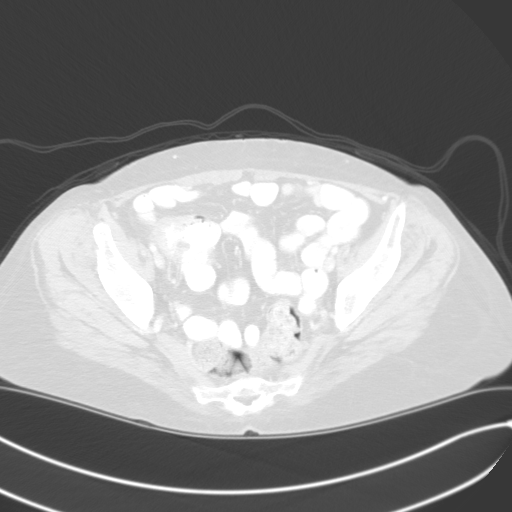
[im 43/129  lung]
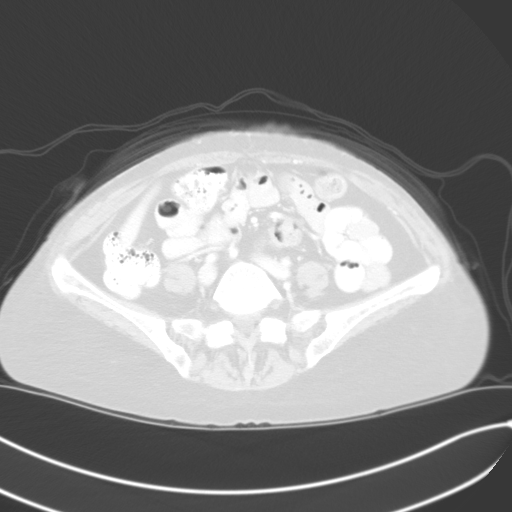
[im 57/129  lung]
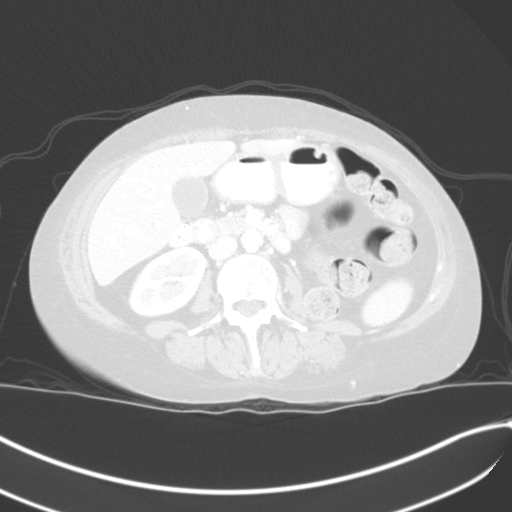
[im 72/129  mediastinal]
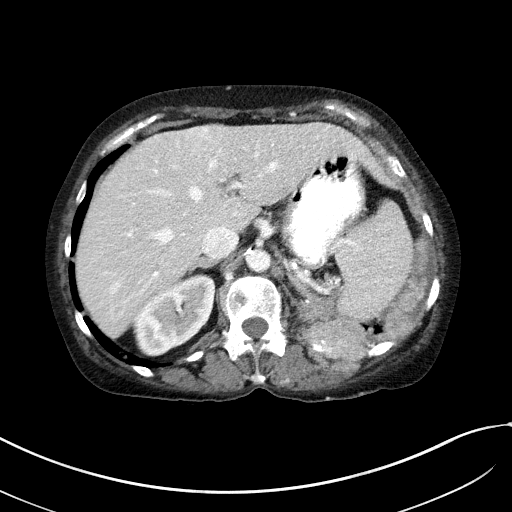
[im 72/129  lung]
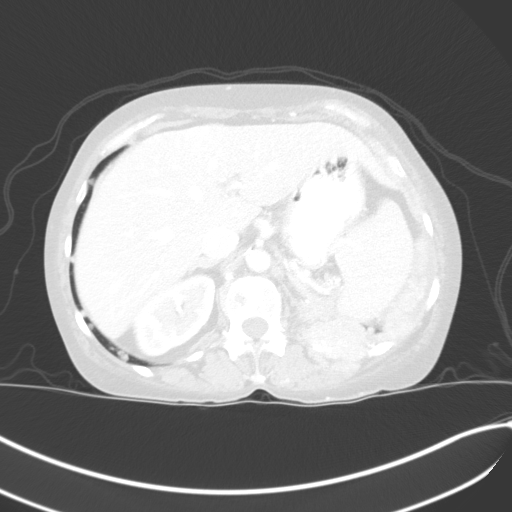
[im 86/129  lung]
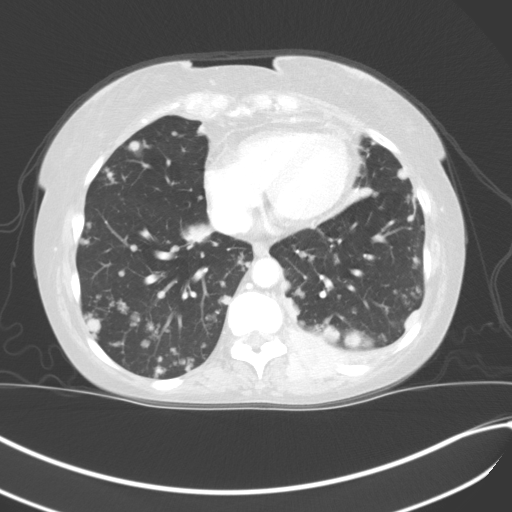
[im 100/129  lung]
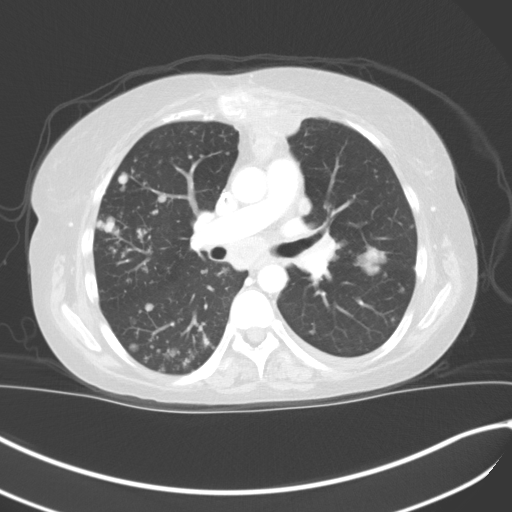
[im 114/129  lung]
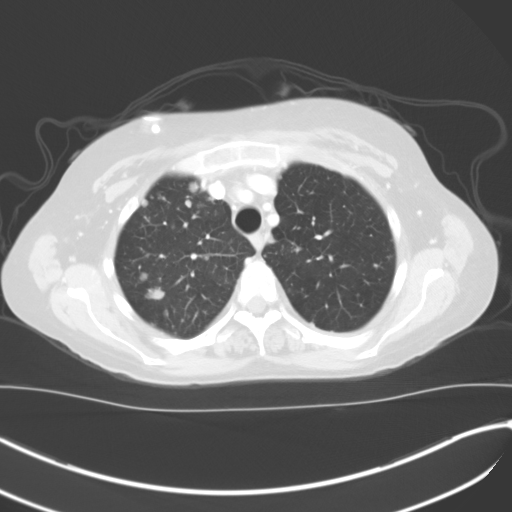

[Series 4: lung · axial · 0.73mm/px · 1 of 164 slices shown]
[im 13/164  lung]
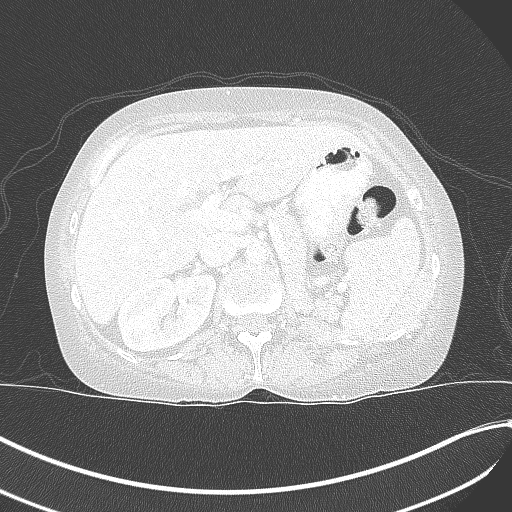

[Series 5: coronals · coronal · 0.85mm/px · 3 of 147 slices shown]
[im 30/147  lung]
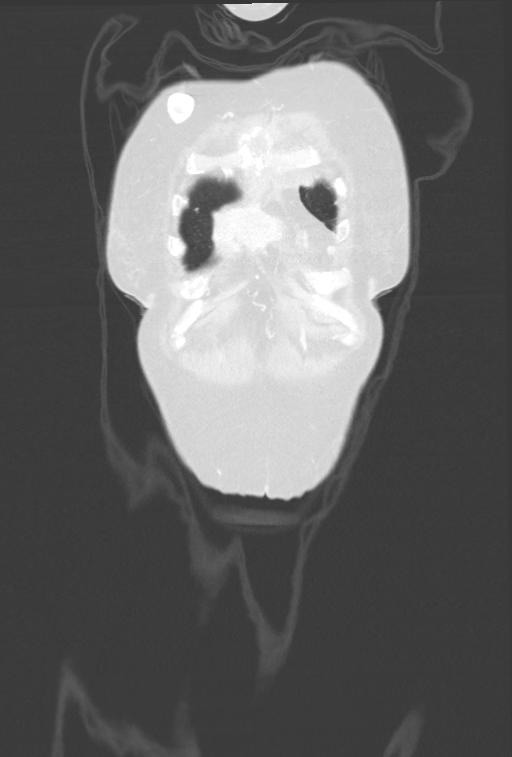
[im 59/147  lung]
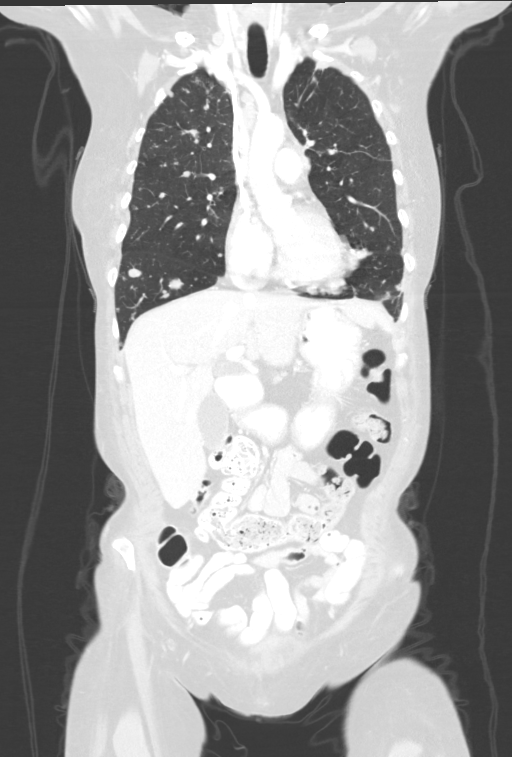
[im 88/147  lung]
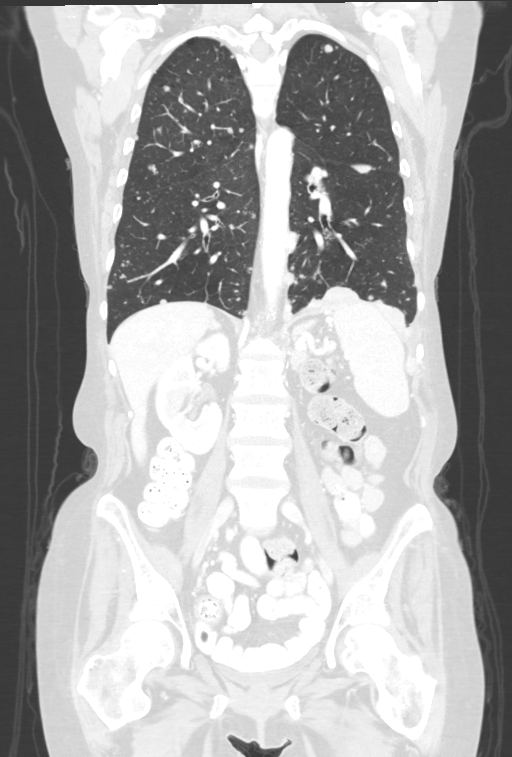

[12 of 36 positions shown; findings below may reference images not displayed]

FINDINGS: CT CHEST FINDINGS

Cardiovascular: Normal heart size. No significant pericardial
effusion/thickening. Coronary atherosclerosis. Right internal
jugular Port-A-Cath terminates at the cavoatrial junction.
Atherosclerotic nonaneurysmal thoracic aorta. Normal caliber
pulmonary arteries. No central pulmonary emboli.

Mediastinum/Nodes: Several hypodense thyroid nodules, largest 1.1 cm
on the right, stable. In the setting of significant comorbidities or
limited life expectancy, no follow-up recommended (ref: [HOSPITAL]. [DATE]): 143-50). Unremarkable esophagus. No axillary
adenopathy. Mild bilateral supraclavicular adenopathy is new,
largest 1.1 cm on the left (series 2/image 5). Enlarged high right
posterior paratracheal 1.9 cm node (series 2/image 10), increased
from 1.4 cm. Enlarged 2.2 cm subcarinal node (series 2/image 31),
decreased from 2.5 cm. Enlarged 1.2 cm AP window node (series
2/image 25), stable. Stable enlarged 3.1 cm right hilar node (series
2/image 38). Stable left hilar adenopathy up to 1.3 cm (series
2/image 31).

Lungs/Pleura: No pneumothorax. No right pleural effusion. Trace
dependent left pleural effusion. Posterior left pleural 7.7 x 5.7 cm
mass invading the left posterior chest wall with new destruction of
portions of the left eleventh and twelfth ribs (series 2/image 48),
substantially increased from 5.2 x 2.2 cm. Peripheral basilar 5.9 x
3.0 cm left pleural mass (series 2/image 64) with destruction of
portions of the lateral left eleventh rib, increased from 4.1 x
cm. Paramediastinal anterior left pleural 4.9 x 3.2 cm mass (series
2/image 24), substantially decreased from 9.3 x 5.8 cm. Innumerable
(> than 50) irregular solid pulmonary nodules scattered throughout
both lungs, increased. Representative 2.8 cm medial right lower lobe
nodule (series 4/image 112), increased from 2.4 cm. Representative
1.8 cm right middle lobe nodule (series 4/image 112), increased from
1.1 cm. Representative 0.7 cm apical right upper lobe nodule (series
4/image 23), increased from 0.4 cm.

Musculoskeletal: Expansile lower sternal 6.4 x 3.8 cm mass (series
2/image 38), substantially increased from 2.4 x 1.5 cm. New 2.4 cm
lytic posterior T11 vertebral lesion with mild mass-effect on
thoracic canal.

CT ABDOMEN PELVIS FINDINGS

Hepatobiliary: New heterogeneously enhancing 4.3 cm segment 4A left
liver mass (series 2/image 55). No additional liver masses.
Cholelithiasis. No significant intrahepatic biliary ductal
dilatation. Stable mildly dilated CBD (9 mm diameter).

Pancreas: Normal, with no mass or duct dilation.

Spleen: Normal size. No mass.

Adrenals/Urinary Tract: No discrete adrenal nodules. Left
nephrectomy. Normal right kidney with no right hydronephrosis.
Normal nondistended bladder.

Stomach/Bowel: Normal non-distended stomach. Normal caliber small
bowel with no small bowel wall thickening. Normal appendix. Apparent
wall thickening in the cecum, favored to be due to peristalsis
(series 2/image 100). No convincing colonic wall thickening,
diverticulosis or significant pericolonic fat stranding.

Vascular/Lymphatic: Atherosclerotic nonaneurysmal abdominal aorta.
Patent portal, splenic, hepatic and right renal veins. No
pathologically enlarged lymph nodes in the abdomen or pelvis.

Reproductive: Grossly normal uterus.  No adnexal mass.

Other: No pneumoperitoneum, ascites or focal fluid collection.

Musculoskeletal: Right iliac wing expansile 2.8 x 2.3 cm mass
(series 2/image 97), mildly decreased from 3.4 x 2.7 cm. Lytic right
L1 vertebral lesion is unchanged. No new osseous lesions. Mild
lumbar spondylosis.
IMPRESSION: 1. Overall progression of metastatic disease, although with some
mixed changes as detailed below.
2. Chest wall-invasive posterior lower left pleural metastases have
significantly increased with destruction of portions of the left
eleventh and twelfth ribs. Previously visualized dominant anterior
left pleural metastasis is significantly decreased.
3. Widespread pulmonary metastases, mildly increased particularly on
the right.
4. New mild bilateral supraclavicular lymphadenopathy. High right
paratracheal adenopathy is increased. Subcarinal adenopathy is
decreased. Additional sites of mediastinal and bilateral hilar
adenopathy are stable.
5. New solitary left liver lobe metastasis.
6. Widespread osseous metastases, significantly increased in the
sternum, new at T11, stable at L1 and decreased in the right iliac
wing.
7. Trace dependent left pleural effusion.
8. Cholelithiasis.  Stable mildly dilated CBD.
9. Aortic Atherosclerosis ([id number]).
